# Patient Record
Sex: Male | Born: 1935 | Race: White | Hispanic: No | Marital: Married | Smoking: Former smoker
Health system: Southern US, Community
[De-identification: ages and names within clinical notes are randomized; demographics above are authoritative.]

## PROBLEM LIST (undated history)

## (undated) DIAGNOSIS — N401 Enlarged prostate with lower urinary tract symptoms: Secondary | ICD-10-CM

## (undated) DIAGNOSIS — I2510 Atherosclerotic heart disease of native coronary artery without angina pectoris: Secondary | ICD-10-CM

## (undated) DIAGNOSIS — I10 Essential (primary) hypertension: Secondary | ICD-10-CM

## (undated) DIAGNOSIS — N200 Calculus of kidney: Secondary | ICD-10-CM

## (undated) DIAGNOSIS — J302 Other seasonal allergic rhinitis: Secondary | ICD-10-CM

## (undated) DIAGNOSIS — E785 Hyperlipidemia, unspecified: Secondary | ICD-10-CM

## (undated) DIAGNOSIS — R338 Other retention of urine: Secondary | ICD-10-CM

## (undated) DIAGNOSIS — I499 Cardiac arrhythmia, unspecified: Secondary | ICD-10-CM

## (undated) DIAGNOSIS — E119 Type 2 diabetes mellitus without complications: Secondary | ICD-10-CM

## (undated) HISTORY — DX: Atherosclerotic heart disease of native coronary artery without angina pectoris: I25.10

## (undated) HISTORY — DX: Hyperlipidemia, unspecified: E78.5

## (undated) HISTORY — PX: TONSILLECTOMY: SUR1361

## (undated) HISTORY — PX: VASECTOMY: SHX75

## (undated) HISTORY — DX: Essential (primary) hypertension: I10

---

## 1993-11-04 HISTORY — PX: OTHER SURGICAL HISTORY: SHX169

## 1998-11-27 ENCOUNTER — Encounter: Payer: Self-pay | Admitting: Neurology

## 1998-11-27 ENCOUNTER — Ambulatory Visit (HOSPITAL_COMMUNITY): Admission: RE | Admit: 1998-11-27 | Discharge: 1998-11-27 | Payer: Self-pay | Admitting: Neurology

## 2000-10-02 ENCOUNTER — Encounter: Payer: Self-pay | Admitting: Emergency Medicine

## 2000-10-02 ENCOUNTER — Emergency Department (HOSPITAL_COMMUNITY): Admission: EM | Admit: 2000-10-02 | Discharge: 2000-10-02 | Payer: Self-pay | Admitting: Emergency Medicine

## 2001-07-31 ENCOUNTER — Encounter: Payer: Self-pay | Admitting: Interventional Cardiology

## 2001-07-31 ENCOUNTER — Ambulatory Visit (HOSPITAL_COMMUNITY): Admission: RE | Admit: 2001-07-31 | Discharge: 2001-07-31 | Payer: Self-pay | Admitting: Interventional Cardiology

## 2007-08-24 ENCOUNTER — Encounter: Admission: RE | Admit: 2007-08-24 | Discharge: 2007-08-24 | Payer: Self-pay | Admitting: Interventional Cardiology

## 2007-08-25 ENCOUNTER — Inpatient Hospital Stay (HOSPITAL_BASED_OUTPATIENT_CLINIC_OR_DEPARTMENT_OTHER): Admission: RE | Admit: 2007-08-25 | Discharge: 2007-08-25 | Payer: Self-pay | Admitting: Interventional Cardiology

## 2007-08-31 ENCOUNTER — Inpatient Hospital Stay (HOSPITAL_COMMUNITY): Admission: RE | Admit: 2007-08-31 | Discharge: 2007-09-01 | Payer: Self-pay | Admitting: Interventional Cardiology

## 2007-11-05 HISTORY — PX: CORONARY ANGIOPLASTY WITH STENT PLACEMENT: SHX49

## 2011-03-19 NOTE — Cardiovascular Report (Signed)
NAMESAMAR, DASS NO.:  [id number]   MEDICAL RECORD NO.:  [id number]          PATIENT TYPE:  INP   LOCATION:  6523                         FACILITY:  [facility]   PHYSICIAN:  Lyn Records, M.D.   DATE OF BIRTH:  1935/04/29   DATE OF PROCEDURE:  08/31/2007  DATE OF DISCHARGE:                            CARDIAC CATHETERIZATION   INDICATIONS:  Recent angina, recent abnormal Cardiolite, recent  demonstration of high-grade disease in the proximal and distal saphenous  vein graft to the PDA and distal circumflex.  The procedure is being  done to stent the saphenous vein graft in the in the spots noted.   PROCEDURE PERFORMED:  1. Filter wire distal protection for the proximal lesion.  2. Direct stenting with bare-metal Vision stent.  3. Vision bare-metal stent distal graft stenosis site.  No      complications.   DESCRIPTION:  After informed consent, a 6-French sheath was placed in  the right femoral artery using the modified Seldinger technique.  A 6-  French right bypass graft guide catheter was used to obtain guiding  shots.  The patient was given a bolus followed by an infusion of  Angiomax 0.75 mg/kg bolus over 2 minutes followed by 1.75 mg/kg per hour  infusion.  ACT was documented to be greater than 300.   A filter wire was placed across the proximal stenosis without difficulty  and deployed.  Direct stenting using an 18 mm x 3.5 Vision stent was  then performed.  Peak deployment pressure was 14 atmospheres.  We then  used a 4.0 x 12 mm Quantum Maverick balloon to 13 atmospheres for 30  seconds.  Good angiographic result was obtained.  The filter wire was  retrieved, and we turned our attention to the distal lesion.  We  predilated with a 2.5 x 12 mm Maverick over a BMW wire.  We then  deployed a 3.5 x 12 mm long Vision stent to 14 atmospheres.  The 3.5 mm  balloon size was clearly large enough for this distal site and there was  heavy proximal obstruction  without complete expansion.  We then placed a  3.5 x 8 mm Quantum proximally within the stent and dilated to 15  atmospheres.  There was still some residual minimal stenosis in the  proximal lesion, but there was wide patency without any evidence of  distal reduction in flow.   Angio-Seal and other closure devices were not being used because of  severe calcification in the femoral.  Final angiographic images were  pristine.  The case was terminated.   CONCLUSION:  1. Successful bare-metal stent implantation in the proximal RCA graft      with reduction to 70% stenosis to less than 10% with TIMI grade 3      flow and final postdilatation to 4-mm diameter.  2. Successful distal saphenous vein graft stenting with a Vision bare-      metal stent with final balloon high-pressure dilatation to 3.5 mm      diameter.  3. Beautiful angiographic results were obtained in each location with  less attempts and residual stenosis noted.  4. We did perform angiography of the native left coronary.  There is      50% protected left main with reference to      flow into the native circumflex.  We contemplated that at some      later staged date that perhaps the left main needs to be stented as      this seemed to be dependent on the flow through collaterals from      the distal right coronary circulation.      Lyn Records, M.D.  Electronically Signed     HWS/MEDQ  D:  08/31/2007  T:  08/31/2007  Job:  [id number]

## 2011-03-19 NOTE — Cardiovascular Report (Signed)
Cody Hobbs, Cody Hobbs NO.:  [id number]   MEDICAL RECORD NO.:  [id number]          PATIENT TYPE:  OIB   LOCATION:  1962                         FACILITY:  [facility]   PHYSICIAN:  Lyn Records, M.D.   DATE OF BIRTH:  02-28-1935   DATE OF PROCEDURE:  DATE OF DISCHARGE:                            CARDIAC CATHETERIZATION   INDICATIONS:  The patient recently had an episode of angina, but has had  none since.  We did a Cardiolite study that demonstrated inferior  infarction with peri-infarction ischemia and mid anterior wall ischemia.  The patient underwent coronary artery bypass graft in 1995 with a LIMA  to the LAD, the sequential saphenous vein graft to the PDA and the  distal circumflex and a saphenous vein graft to the mid obtuse marginal  of the circumflex.   PROCEDURES PERFORMED:  1. Left heart catheterization.  2. Selective coronary angiography.  3. Left ventriculography.  4. Bypass graft angiography.  5. Left ventriculography.  6. Abdominal aortography.   DESCRIPTION:  After informed consent, a 4-French sheath was placed in  the right femoral artery with some difficulty using the Doppler needle.  We then performed coronary angiography using a 4-French A2 multipurpose  catheter.  This catheter was used for hemodynamic recordings, left  ventriculography by hand injection, and saphenous vein graft  angiography.  We used a #4 4-French left Judkins catheter for left  coronary angiography.  We used a #4 internal mammary artery catheter for  saphenous vein graft angiography and left internal mammary graft  angiography.  We could never selectively engage in the right coronary.  This vessel is known to be totally occluded based upon the appearance of  the right coronary graft.   We then performed abdominal aortography using a 4-French pigtail  catheter using 40 mL of contrast at 16 mL per second.  The patient  tolerated the procedure without complications.   RESULTS:  1. Hemodynamic data:      a.     Aortic pressure 133/74.      b.     Left ventricular pressure 133/70 mmHg.  2. Left ventriculography:  The left ventricle is normal in size.      Inferior hypokinesis is noted.  Ejection fraction 55%.  No obvious      mitral regurgitation.  3. Coronary angiography.      a.     Left main coronary:  Left main contains proximal angulation       with 50-75% stenosis.      b.     Left anterior descending coronary:  Left anterior descending       coronary artery is totally occluded at the origin from the left       main.      c.     Circumflex artery:  The circumflex coronary artery is widely       patent arising from the distal left main.  The vessel is totally       occluded in the second obtuse marginal, which is supplied by a       distal saphenous vein graft anastomosis  to the right coronary.       The graft to the continuation portion of second obtuse marginal is       totally occluded.  The attenuation of the circumflex after the       first obtuse marginal is patent given origin to an additional       small obtuse marginal branch.      d.     Right coronary:  Right coronary is assumed to be totally       occluded at the aorto-ostial junction, as we were unable to       selectively engage this vessel.  4. Bypass graft angiography.      a.     Saphenous vein graft.  The right coronary and distal       circumflex:  This graft is patent, but contains eccentric       degenerative disease in the proximal vessel that obstructs it by       up to 50-70% depending upon view.  The distal anastomosis on the       PDA is patent with 50-60% narrowing at the anastomosis.  The       continuation of this graft to the distal circumflex contains a       relatively focal 99% stenosis distally.      b.     Saphenous vein graft to the obtuse marginal:  Totally       occluded at the aorto-ostial junction.      c.     Left internal mammary graft to the LAD:   Widely patent.       There is minimal distal disease in the LAD beyond the graft       insertion site.  There is significant disease proximal to the       graft insertion site with 1 septal perforator threatened.  5. Abdominal aortography:  No evidence of abdominal aortic aneurysm is      noted.  There is ostial/proximal eccentric 50-60% right renal      artery stenosis.  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   CONCLUSIONS:  1. 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  The proximal portion of this graft also contains      eccentric moderate stenosis.  2. Patent LIMA to the LAD.  3. Severe native coronary disease with total occlusion of the right      coronary and LAD and a 60-70% ostial left main stenosis.  The left      main supplies predominately, the remainder of the circumflex that      is not supplied by grafts.  4. Low normal LV function with inferior wall severe hypokinesis.  EF      55%.   PLAN:  1. Start Plavix.  2. Consider PCI on the distal RCA graft to the PDA continuation.  3. Consider PCI of the protected left main that supplies the      circumflex.  Will speak with the patient concerning  this.  Surgery      really be an option at this time given the wide patency of the left  main of the LIMA to the LAD.      Lyn Records, M.D.  Electronically Signed     HWS/MEDQ  D:  08/25/2007  T:  08/25/2007  Job:  [id number]   cc:   L. Lupe Carney, M.D.

## 2011-08-14 LAB — BASIC METABOLIC PANEL
BUN: 10
CO2: 27
CO2: 29
Calcium: 9.9
Chloride: 101
Chloride: 104
Creatinine, Ser: 0.98
GFR calc Af Amer: >60
GFR calc Af Amer: >60
GFR calc non Af Amer: >60
Glucose, Bld: 116 — ABNORMAL HIGH
Potassium: 4.3
Sodium: 138
Sodium: 140

## 2011-08-14 LAB — CBC
HCT: 46
Hemoglobin: 16
Hemoglobin: 17.1 — ABNORMAL HIGH
MCHC: 34.8
MCV: 92.2
RBC: 4.99
RBC: 5.4
WBC: 8

## 2012-10-26 ENCOUNTER — Other Ambulatory Visit: Payer: Self-pay | Admitting: Interventional Cardiology

## 2012-10-29 ENCOUNTER — Encounter (HOSPITAL_COMMUNITY): Payer: Self-pay

## 2012-10-30 ENCOUNTER — Encounter (HOSPITAL_COMMUNITY)
Admission: RE | Disposition: A | Discharge status: Home / Self Care | Payer: Self-pay | Source: Ambulatory Visit | Attending: Interventional Cardiology

## 2012-10-30 ENCOUNTER — Ambulatory Visit (HOSPITAL_COMMUNITY)
Admission: RE | Admit: 2012-10-30 | Discharge: 2012-10-30 | Disposition: A | Discharge status: Home / Self Care | Payer: Medicare Other | Source: Ambulatory Visit | Attending: Interventional Cardiology | Admitting: Interventional Cardiology

## 2012-10-30 DIAGNOSIS — I209 Angina pectoris, unspecified: Secondary | ICD-10-CM

## 2012-10-30 DIAGNOSIS — R9439 Abnormal result of other cardiovascular function study: Secondary | ICD-10-CM | POA: Insufficient documentation

## 2012-10-30 DIAGNOSIS — I25810 Atherosclerosis of coronary artery bypass graft(s) without angina pectoris: Secondary | ICD-10-CM | POA: Insufficient documentation

## 2012-10-30 DIAGNOSIS — I2510 Atherosclerotic heart disease of native coronary artery without angina pectoris: Secondary | ICD-10-CM | POA: Insufficient documentation

## 2012-10-30 DIAGNOSIS — I2582 Chronic total occlusion of coronary artery: Secondary | ICD-10-CM | POA: Insufficient documentation

## 2012-10-30 DIAGNOSIS — I200 Unstable angina: Secondary | ICD-10-CM | POA: Insufficient documentation

## 2012-10-30 HISTORY — PX: LEFT AND RIGHT HEART CATHETERIZATION WITH CORONARY/GRAFT ANGIOGRAM: SHX5448

## 2012-10-30 SURGERY — LEFT AND RIGHT HEART CATHETERIZATION WITH CORONARY/GRAFT ANGIOGRAM
Anesthesia: LOCAL

## 2012-10-30 MED ORDER — MIDAZOLAM HCL 2 MG/2ML IJ SOLN
INTRAMUSCULAR | Status: AC
  Filled 2012-10-30: qty 2

## 2012-10-30 MED ORDER — LIDOCAINE HCL (PF) 1 % IJ SOLN
INTRAMUSCULAR | Status: AC
  Filled 2012-10-30: qty 30

## 2012-10-30 MED ORDER — SODIUM CHLORIDE 0.9 % IJ SOLN: 3.0000 mL | INTRAMUSCULAR | Status: DC | PRN

## 2012-10-30 MED ORDER — SODIUM CHLORIDE 0.9 % IJ SOLN: 3.0000 mL | Freq: Two times a day (BID) | INTRAMUSCULAR | Status: DC

## 2012-10-30 MED ORDER — SODIUM CHLORIDE 0.9 % IV SOLN
250.0000 mL | INTRAVENOUS | Status: DC | PRN
  Administered 2012-10-30: 250 mL via INTRAVENOUS

## 2012-10-30 MED ORDER — FENTANYL CITRATE 0.05 MG/ML IJ SOLN
INTRAMUSCULAR | Status: AC
  Filled 2012-10-30: qty 2

## 2012-10-30 MED ORDER — DIAZEPAM 5 MG PO TABS
5.0000 mg | ORAL_TABLET | ORAL | Status: AC
  Administered 2012-10-30: 5 mg via ORAL

## 2012-10-30 MED ORDER — NITROGLYCERIN 0.2 MG/ML ON CALL CATH LAB
INTRAVENOUS | Status: AC
  Filled 2012-10-30: qty 1

## 2012-10-30 MED ORDER — SODIUM CHLORIDE 0.9 % IV SOLN
INTRAVENOUS | Status: DC
  Administered 2012-10-30: 08:00:00 via INTRAVENOUS

## 2012-10-30 MED ORDER — SODIUM CHLORIDE 0.9 % IV SOLN: INTRAVENOUS | Status: DC

## 2012-10-30 MED ORDER — ASPIRIN 81 MG PO CHEW
324.0000 mg | CHEWABLE_TABLET | ORAL | Status: AC
  Administered 2012-10-30: 324 mg via ORAL

## 2012-10-30 MED ORDER — ACETAMINOPHEN 325 MG PO TABS: 650.0000 mg | ORAL_TABLET | ORAL | Status: DC | PRN

## 2012-10-30 MED ORDER — HEPARIN (PORCINE) IN NACL 2-0.9 UNIT/ML-% IJ SOLN
INTRAMUSCULAR | Status: AC
  Filled 2012-10-30: qty 1000

## 2012-10-30 MED ORDER — ONDANSETRON HCL 4 MG/2ML IJ SOLN: 4.0000 mg | Freq: Four times a day (QID) | INTRAMUSCULAR | Status: DC | PRN

## 2012-10-30 NOTE — H&P (Signed)
The patient has a prior history of coronary artery disease. He has no bypass graft failure with occlusion of the saphenous vein graft to the circumflex system. In 2008 because of angina he was found to have high-grade obstruction in the proximal and distal portion of the saphenous vein graft to the right coronary. 2 stents were placed. He had a patent LIMA to the LAD at that time. The circumflex territory was dependent on all negative circulation. He had an early positive exercise treadmill test with ST depression and precordial ST abnormality less than 6 minutes on the treadmill.  The patient is undergoing diagnostic catheterization to define anatomy and help at therapy. The risk of the procedure including stroke, death, myocardial infarction, bleeding, emergency surgery, limb ischemia, among other this discussed with the patient in detail and accepted.

## 2012-10-30 NOTE — CV Procedure (Signed)
     Diagnostic Cardiac Catheterization Report  Cody Hobbs  77 y.o.  male 1935/10/19  Procedure Date: 10/30/2012 Referring Physician: Wyvonnia Lora, M.D. Primary Cardiologist:: Elwanda Brooklyn. Katrinka Blazing III M.D.   PROCEDURE:  Left heart catheterization with selective coronary angiography, left ventriculogram.  INDICATIONS:  Early positive exercise treadmill test with recent exertional angina, progressive over the past 6-9 months. History of coronary artery bypass grafting 1995, and PCI with proximal and distal stents placed in the RCA graft 2008.  The risks, benefits, and details of the procedure were explained to the patient.  The patient verbalized understanding and wanted to proceed.  Informed written consent was obtained.  PROCEDURE TECHNIQUE:  After Xylocaine anesthesia a 5 French sheath was placed in the right femoral artery with a single anterior needle wall stick.   Coronary angiography was done using a 5 French A2 MP and IMA catheter.  Left ventriculography was done using a 5 [place] A2 MP catheter.    CONTRAST:  Total of 105 cc.  COMPLICATIONS:  None.    HEMODYNAMICS:  Aortic pressure was 152/71 mmHg; LV pressure was 157/3 mmHg; LVEDP 13 mm mercury.  There was no gradient between the left ventricle and aorta.    ANGIOGRAPHIC DATA:   The left main coronary artery is 50% ostial and 80% distal. Heavy calcification is noted..  The left anterior descending artery is totally occluded at the left main distal ostium.  The left circumflex artery is heavily calcified. 3 obtuse marginal branches are noted. The second obtuse marginal is a dominant branch. It is a site of prior grafting. This vessel is diffusely diseased and 10 to anteriorly by the previous graft the first and third obtuse marginal branches are small and probably not graftable. The distal circumflex flex supplies collaterals to the distal right coronary  The right coronary artery is heavily calcified and totally occluded  in the proximal vessel. The distal right coronary supplied by left coronary collaterals via the circumflex . Collaterals are not identified connecting the left anterior descending to the right coronary territory  LEFT VENTRICULOGRAM:  Left ventricular angiogram was done in the 30 RAO projection and revealed normal left ventricular wall motion and systolic function with an estimated ejection fraction of 50 %.  BYPASS GRAFT ANGIOGRAPHY:    SVG to obtuse marginal #1: Totally occluded  SVG sequential to PDA and PLA: Totally occluded  IMPRESSIONS:  1. Saphenous vein graft failure with occlusion of the sequential saphenous vein graft to the PDA/PLA, and total occlusion of the SVG to the circumflex.  2. Widely patent left internal mammary artery graft to the LAD  3. Severe native vessel disease with total occlusion of the right coronary artery, LAD, and ostial and distal native left main disease up to 80%. The second obtuse marginal which is dominant circumflex branch is diffusely diseased and was assigned a prior grafting .  4. Overall normal left ventricular function EF 50%  RECOMMENDATION:  1. Consult Dr. Evelene Croon for consideration of repeat coronary bypass graft.

## 2012-11-02 DIAGNOSIS — I2510 Atherosclerotic heart disease of native coronary artery without angina pectoris: Secondary | ICD-10-CM | POA: Insufficient documentation

## 2012-11-02 DIAGNOSIS — E785 Hyperlipidemia, unspecified: Secondary | ICD-10-CM | POA: Insufficient documentation

## 2012-11-02 DIAGNOSIS — I10 Essential (primary) hypertension: Secondary | ICD-10-CM | POA: Insufficient documentation

## 2012-11-03 ENCOUNTER — Encounter: Payer: Medicare Other | Admitting: Surgery

## 2012-11-05 ENCOUNTER — Other Ambulatory Visit: Payer: Self-pay | Admitting: Surgery

## 2012-11-05 ENCOUNTER — Encounter: Payer: Self-pay | Admitting: Surgery

## 2012-11-05 ENCOUNTER — Institutional Professional Consult (permissible substitution) (INDEPENDENT_AMBULATORY_CARE_PROVIDER_SITE_OTHER): Payer: Medicare Other | Admitting: Surgery

## 2012-11-05 VITALS — BP 142/84 | HR 76 | Resp 20 | Ht 76.0 in | Wt 220.0 lb

## 2012-11-05 DIAGNOSIS — I2510 Atherosclerotic heart disease of native coronary artery without angina pectoris: Secondary | ICD-10-CM

## 2012-11-05 DIAGNOSIS — Z951 Presence of aortocoronary bypass graft: Secondary | ICD-10-CM

## 2012-11-09 ENCOUNTER — Encounter: Payer: Self-pay | Admitting: Surgery

## 2012-11-09 NOTE — Progress Notes (Signed)
[address]            Cody Hobbs [id number]          [phone]      PCP is Cody Boston, MD Referring Provider is Cody Noe, MD  Chief Complaint  Patient presents with  . Coronary Artery Disease    cardiac cath 10/30/12, eval for re-do of CABG, HX of CABG 1995    HPI:  The patient is a 78 year old who underwent coronary bypass graft surgery x4 by me in November of 1995. He had a left internal mammary graft to the LAD with a saphenous vein graft to the second obtuse marginal branch and a sequential saphenous vein graft to the posterior descending branch of the right coronary artery and the posterior lateral branch of the left circumflex coronary artery. He subsequently had a PCI with proximal and distal stents placed in the right coronary vein graft in 2008 or 2009. He was recently seen in annual followup by Dr. Katrinka Hobbs and related that he had had one episode of substernal chest discomfort with heavy exertion while he was deer hunting. He said this resolved spontaneously. He denies any other episodes of chest discomfort or shortness of breath. He had an early positive exercise treadmill test with stress images showing a moderate to severe perfusion defect due to infarct or scar with mild peri-infarct ischemia in the basal inferoseptal, basal inferior, and mid inferior regions. There was a mild perfusion defect due to infarct or scar with moderate peri-infarct ischemia in the basal anterolateral, mid inferior lateral, mid anterolateral, and apical lateral regions. There was a mild perfusion defect due to infarct or scar with mild peri-infarct ischemia in the basal anterior, basal anterolateral, and mid anterior regions. The post-stress ejection fraction is 59%. The stress ECG showed ischemic changes from the baseline ECG. He subsequently underwent cardiac catheterization on 10/30/2012 which showed occlusion of the 2 previous saphenous vein grafts. There  is a widely patent left internal mammary graft to the LAD. There is severe native vessel disease with total occlusion of the right coronary artery, LAD, and ostial and distal native left main disease up to 80%. The second obtuse marginal branch was the dominant left circumflex branch and was diffusely diseased.   Past Medical History  Diagnosis Date  . CAD (coronary artery disease)   . Hypertension   . Hyperlipidemia     Past Surgical History  Procedure Date  . Tonsillectomy   . Vasectomy   . Cabg with lima to lad, svg to om2 and sequential svg to pda and distal circumflex 1995  . Coronary angioplasty with stent placement 2009    Family History  Problem Relation Age of Onset  . Heart attack Father   . Stomach cancer Mother   . Heart attack Brother   . Breast cancer Sister     Social History History  Substance Use Topics  . Smoking status: Former Smoker    Types: Cigarettes    Start date: 11/04/1992  . Smokeless tobacco: Never Used  . Alcohol Use: Not on file    Current Outpatient Prescriptions  Medication Sig Dispense Refill  . aspirin EC 81 MG tablet Take 81 mg by mouth daily.      Marland Kitchen atenolol (TENORMIN) 50 MG tablet Take 50 mg by mouth daily.      . simvastatin (ZOCOR) 40 MG  tablet Take 40 mg by mouth every evening.        Allergies  Allergen Reactions  . Penicillins Itching    Review of Systems  Constitutional: Negative for fever, chills, diaphoresis, activity change, appetite change, fatigue and unexpected weight change.  HENT: Negative.   Eyes: Negative.   Respiratory: Negative.   Cardiovascular: Positive for chest pain. Negative for palpitations and leg swelling.       Once  Gastrointestinal: Negative.   Genitourinary: Positive for frequency.  Musculoskeletal: Negative.   Neurological: Negative.   Hematological: Bruises/bleeds easily.  Psychiatric/Behavioral: Negative.     BP 142/84  Pulse 76  Resp 20  Ht 6\' 4"  (1.93 m)  Wt 220 lb (99.791 kg)   BMI 26.78 kg/m2  SpO2 95% Physical Exam  Constitutional: He is oriented to person, place, and time. He appears well-developed and well-nourished. No distress.  HENT:  Head: Normocephalic and atraumatic.  Mouth/Throat: Oropharynx is clear and moist.  Eyes: Conjunctivae normal and EOM are normal. Pupils are equal, round, and reactive to light.  Neck: Normal range of motion. Neck supple. No JVD present. No thyromegaly present.  Cardiovascular: Normal rate, regular rhythm, normal heart sounds and intact distal pulses.  Exam reveals no gallop and no friction rub.   No murmur heard. Pulmonary/Chest: Effort normal and breath sounds normal. No respiratory distress.  Abdominal: Soft. Bowel sounds are normal. He exhibits no distension and no mass. There is no tenderness.  Musculoskeletal: Normal range of motion. He exhibits no edema.  Neurological: He is alert and oriented to person, place, and time. He has normal strength. No cranial nerve deficit or sensory deficit.  Skin: Skin is warm and dry.  Psychiatric: He has a normal mood and affect.     Cardiac Cath:  HEMODYNAMICS:  Aortic pressure was 152/71 mmHg; LV pressure was 157/3 mmHg; LVEDP 13 mm mercury.  There was no gradient between the left ventricle and aorta.    ANGIOGRAPHIC DATA:   The left main coronary artery is 50% ostial and 80% distal. Heavy calcification is noted..  The left anterior descending artery is totally occluded at the left main distal ostium.  The left circumflex artery is heavily calcified. 3 obtuse marginal branches are noted. The second obtuse marginal is a dominant branch. It is a site of prior grafting. This vessel is diffusely diseased and 10 to anteriorly by the previous graft the first and third obtuse marginal branches are small and probably not graftable. The distal circumflex flex supplies collaterals to the distal right coronary  The right coronary artery is heavily calcified and totally occluded in the  proximal vessel. The distal right coronary supplied by left coronary collaterals via the circumflex . Collaterals are not identified connecting the left anterior descending to the right coronary territory  LEFT VENTRICULOGRAM:  Left ventricular angiogram was done in the 30 RAO projection and revealed normal left ventricular wall motion and systolic function with an estimated ejection fraction of 50 %.  BYPASS GRAFT ANGIOGRAPHY:    SVG to obtuse marginal #1: Totally occluded  SVG sequential to PDA and PLA: Totally occluded  IMPRESSIONS:  1. Saphenous vein graft failure with occlusion of the sequential saphenous vein graft to the PDA/PLA, and total occlusion of the SVG to the circumflex.  2. Widely patent left internal mammary artery graft to the LAD  3. Severe native vessel disease with total occlusion of the right coronary artery, LAD, and ostial and distal native left main disease up to 80%.  The second obtuse marginal which is dominant circumflex branch is diffusely diseased and was assigned a prior grafting .  4. Overall normal left ventricular function EF 50%  RECOMMENDATION:  1. Consult Dr. Evelene Croon for consideration of repeat coronary bypass graft.   Impression/plan:  He has severe left main and native three-vessel coronary disease with an occluded right coronary artery that is being supplied distally by collaterals from the left circumflex coronary artery. Therefore the left main disease puts the left circumflex and right coronary territories at risk. The second obtuse marginal branch is diffusely diseased and may not be graftable. I think the posterior descending and posterior lateral branches looked like they should be graftable which would supply some degree of protection against occlusion of the left main coronary. He is still quite active at 78 years old and in fairly good shape so I think redo coronary bypass graft surgery is probably the best alternative for him. I reviewed  the catheterization findings with the patient and his wife. All of their questions were answered. They would like to wait until April to do surgery since his wife is a Chart[employer] loss adjuster and will not be able to take off until then. Since he has only had one episode of chest discomfort with heavy exertion I think it would be reasonable to wait as long as he does not have any further symptoms. He and his wife are in agreement with that and they will check their calendar and schedule surgery for April. I asked him to contact Dr. Katrinka Hobbs and myself if he has any progression of symptoms.

## 2012-11-17 ENCOUNTER — Other Ambulatory Visit: Payer: Self-pay | Admitting: *Deleted

## 2012-11-17 DIAGNOSIS — I2510 Atherosclerotic heart disease of native coronary artery without angina pectoris: Secondary | ICD-10-CM

## 2012-11-18 ENCOUNTER — Encounter (HOSPITAL_COMMUNITY): Payer: Self-pay | Admitting: Pharmacy Technician

## 2012-11-23 ENCOUNTER — Encounter (HOSPITAL_COMMUNITY): Payer: Self-pay

## 2012-11-23 ENCOUNTER — Ambulatory Visit (HOSPITAL_COMMUNITY)
Admission: RE | Admit: 2012-11-23 | Discharge: 2012-11-23 | Disposition: A | Discharge status: Home / Self Care | Payer: Medicare Other | Source: Ambulatory Visit | Attending: Surgery | Admitting: Surgery

## 2012-11-23 ENCOUNTER — Encounter (HOSPITAL_COMMUNITY)
Admission: RE | Admit: 2012-11-23 | Discharge: 2012-11-23 | Disposition: A | Discharge status: Home / Self Care | Payer: Medicare Other | Source: Ambulatory Visit | Attending: Surgery | Admitting: Surgery

## 2012-11-23 VITALS — BP 133/85 | HR 76 | Temp 98.6°F | Resp 18 | Ht 76.0 in | Wt 222.9 lb

## 2012-11-23 DIAGNOSIS — Z01812 Encounter for preprocedural laboratory examination: Secondary | ICD-10-CM | POA: Insufficient documentation

## 2012-11-23 DIAGNOSIS — I2510 Atherosclerotic heart disease of native coronary artery without angina pectoris: Secondary | ICD-10-CM | POA: Insufficient documentation

## 2012-11-23 DIAGNOSIS — I10 Essential (primary) hypertension: Secondary | ICD-10-CM | POA: Insufficient documentation

## 2012-11-23 DIAGNOSIS — Z01811 Encounter for preprocedural respiratory examination: Secondary | ICD-10-CM | POA: Insufficient documentation

## 2012-11-23 DIAGNOSIS — I6529 Occlusion and stenosis of unspecified carotid artery: Secondary | ICD-10-CM | POA: Insufficient documentation

## 2012-11-23 DIAGNOSIS — Z01810 Encounter for preprocedural cardiovascular examination: Secondary | ICD-10-CM | POA: Insufficient documentation

## 2012-11-23 HISTORY — DX: Calculus of kidney: N20.0

## 2012-11-23 LAB — BLOOD GAS, ARTERIAL
Acid-Base Excess: 0.5 mmol/L (ref 0.0–2.0)
Bicarbonate: 24.6 mEq/L — ABNORMAL HIGH (ref 20.0–24.0)
Drawn by: 344381
O2 Saturation: 96.3 %
TCO2: 25.7 mmol/L (ref 0–100)
pCO2 arterial: 38.9 mmHg (ref 35.0–45.0)
pO2, Arterial: 78.2 mmHg — ABNORMAL LOW (ref 80.0–100.0)

## 2012-11-23 LAB — COMPREHENSIVE METABOLIC PANEL
ALT: 20 U/L (ref 0–53)
AST: 26 U/L (ref 0–37)
CO2: 20 mEq/L (ref 19–32)
Calcium: 9.7 mg/dL (ref 8.4–10.5)
Chloride: 103 mEq/L (ref 96–112)
Creatinine, Ser: 0.92 mg/dL (ref 0.50–1.35)
GFR calc Af Amer: >90 mL/min (ref >90)
GFR calc non Af Amer: 80 mL/min — ABNORMAL LOW (ref >90)
Glucose, Bld: 101 mg/dL — ABNORMAL HIGH (ref 70–99)
Sodium: 137 mEq/L (ref 135–145)
Total Bilirubin: 0.6 mg/dL (ref 0.3–1.2)

## 2012-11-23 LAB — CBC
Hemoglobin: 17.1 g/dL — ABNORMAL HIGH (ref 13.0–17.0)
MCH: 33.3 pg (ref 26.0–34.0)
MCV: 93 fL (ref 78.0–100.0)
Platelets: 192 10*3/uL (ref 150–400)
RBC: 5.13 MIL/uL (ref 4.22–5.81)
WBC: 8.4 10*3/uL (ref 4.0–10.5)

## 2012-11-23 LAB — PULMONARY FUNCTION TEST

## 2012-11-23 LAB — SURGICAL PCR SCREEN: MRSA, PCR: NEGATIVE

## 2012-11-23 LAB — URINALYSIS, ROUTINE W REFLEX MICROSCOPIC
Leukocytes, UA: NEGATIVE
Nitrite: NEGATIVE
Protein, ur: NEGATIVE mg/dL
Specific Gravity, Urine: 1.027 (ref 1.005–1.030)
Urobilinogen, UA: 0.2 mg/dL (ref 0.0–1.0)

## 2012-11-23 NOTE — Progress Notes (Signed)
Pre-op Cardiac Surgery  Carotid Findings:  Right = <40% ICA stenosis. Left = 40-59% ICA stenosis. Antegrade vertebral flow.  Upper Extremity Right Left  Brachial Pressures 145 140  Radial Waveforms Tri Tri  Ulnar Waveforms Tri Tri  Palmar Arch (Allen's Test) Decreases >50% with radial compression, normal with ulnar compression Normal with radial compression, obliterates with ulnar compression    Lower  Extremity Right Left  Dorsalis Pedis 130, Bi 160, Tri  Anterior Tibial    Posterior Tibial 118, Bi 137, Bi  Ankle/Brachial Indices 0.9 1.1    Findings:  Right ABI= mild arterial disease. Left ABI= within normal limits.  Farrel Demark, RDMS, RVT 11/23/2012

## 2012-11-23 NOTE — Pre-Procedure Instructions (Addendum)
GOVANNI PLEMONS  11/23/2012   Your procedure is scheduled on:  Wednesday, January 22nd.  Report to [facility] at 6:30AM.    Call this number if you have problems the morning of surgery: [phone]   Remember:   Do not eat food or drink liquids after midnight.   Take these medicines the morning of surgery with A SIP OF WATER: Atenolol (Tenormin).  Stop taking Aspirin, Coumadin, Plavix, Effient and Herbal medications.  Don not take any NSAIDs ie: Ibuprofen,  Advil,Naproxen or any medication containing Aspirin.   Do not wear jewelry, make-up or nail polish.  Do not wear lotions, powders, or perfumes. You may wear deodorant.  Do not shave 48 hours prior to surgery. Men may shave face and neck.  Do not bring valuables to the hospital.  Contacts, dentures or bridgework may not be worn into surgery.  Leave suitcase in the car. After surgery it may be brought to your room.  For patients admitted to the hospital, checkout time is 11:00 AM the day of discharge.   Patients discharged the day of surgery will not be allowed to drive home.  Name and phone number of your driver: NA   Special Instructions: Shower using CHG 2 nights before surgery and the night before surgery.  If you shower the day of surgery use CHG.  Use special wash - you have one bottle of CHG for all showers.  You should use approximately 1/3 of the bottle for each shower.   Please read over the following fact sheets that you were given: Pain Booklet, Coughing and Deep Breathing, Blood Transfusion Information and Surgical Site Infection Prevention

## 2012-11-24 MED ORDER — VERAPAMIL HCL 2.5 MG/ML IV SOLN
INTRAVENOUS | Status: AC
  Administered 2012-11-25: 10:00:00
  Filled 2012-11-24 (×2): qty 2.5

## 2012-11-24 MED ORDER — VANCOMYCIN HCL 10 G IV SOLR
1500.0000 mg | INTRAVENOUS | Status: AC
  Administered 2012-11-25: 1500 mg via INTRAVENOUS
  Filled 2012-11-24: qty 1500

## 2012-11-24 MED ORDER — MAGNESIUM SULFATE 50 % IJ SOLN
40.0000 meq | INTRAMUSCULAR | Status: DC
  Filled 2012-11-24 (×2): qty 10

## 2012-11-24 MED ORDER — PHENYLEPHRINE HCL 10 MG/ML IJ SOLN
30.0000 ug/min | INTRAVENOUS | Status: AC
  Administered 2012-11-25: 60 ug/min via INTRAVENOUS
  Filled 2012-11-24 (×2): qty 2

## 2012-11-24 MED ORDER — METOPROLOL TARTRATE 12.5 MG HALF TABLET: 12.5000 mg | ORAL_TABLET | Freq: Once | ORAL | Status: DC

## 2012-11-24 MED ORDER — SODIUM CHLORIDE 0.9 % IV SOLN
INTRAVENOUS | Status: AC
  Administered 2012-11-25: 2.4 [IU]/h via INTRAVENOUS
  Filled 2012-11-24 (×2): qty 1

## 2012-11-24 MED ORDER — DOPAMINE-DEXTROSE 3.2-5 MG/ML-% IV SOLN
2.0000 ug/kg/min | INTRAVENOUS | Status: DC
  Filled 2012-11-24: qty 250

## 2012-11-24 MED ORDER — SODIUM CHLORIDE 0.9 % IV SOLN
INTRAVENOUS | Status: AC
  Administered 2012-11-25: 70 mL/h via INTRAVENOUS
  Filled 2012-11-24 (×2): qty 40

## 2012-11-24 MED ORDER — NITROGLYCERIN IN D5W 200-5 MCG/ML-% IV SOLN
2.0000 ug/min | INTRAVENOUS | Status: AC
  Administered 2012-11-25: 5 ug/min via INTRAVENOUS
  Filled 2012-11-24 (×2): qty 250

## 2012-11-24 MED ORDER — POTASSIUM CHLORIDE 2 MEQ/ML IV SOLN
80.0000 meq | INTRAVENOUS | Status: DC
  Filled 2012-11-24 (×2): qty 40

## 2012-11-24 MED ORDER — DEXMEDETOMIDINE HCL IN NACL 400 MCG/100ML IV SOLN
0.1000 ug/kg/h | INTRAVENOUS | Status: AC
  Administered 2012-11-25: .2 ug/kg/h via INTRAVENOUS
  Filled 2012-11-24 (×2): qty 100

## 2012-11-24 MED ORDER — DEXTROSE 5 % IV SOLN
0.5000 ug/min | INTRAVENOUS | Status: DC
  Filled 2012-11-24 (×2): qty 4

## 2012-11-24 MED ORDER — LEVOFLOXACIN IN D5W 500 MG/100ML IV SOLN
500.0000 mg | INTRAVENOUS | Status: AC
  Administered 2012-11-25: .5 g via INTRAVENOUS
  Filled 2012-11-24 (×2): qty 100

## 2012-11-25 ENCOUNTER — Inpatient Hospital Stay (HOSPITAL_COMMUNITY): Payer: Medicare Other | Admitting: Anesthesiology

## 2012-11-25 ENCOUNTER — Encounter (HOSPITAL_COMMUNITY): Payer: Self-pay | Admitting: Anesthesiology

## 2012-11-25 ENCOUNTER — Encounter (HOSPITAL_COMMUNITY)
Admission: RE | Disposition: A | Discharge status: Home / Self Care | Payer: Self-pay | Source: Ambulatory Visit | Attending: Surgery

## 2012-11-25 ENCOUNTER — Inpatient Hospital Stay (HOSPITAL_COMMUNITY): Payer: Medicare Other

## 2012-11-25 ENCOUNTER — Encounter (HOSPITAL_COMMUNITY): Payer: Self-pay | Admitting: Surgery

## 2012-11-25 ENCOUNTER — Inpatient Hospital Stay (HOSPITAL_COMMUNITY)
Admission: RE | Admit: 2012-11-25 | Discharge: 2012-11-29 | DRG: 236 | Disposition: A | Discharge status: Home / Self Care | Payer: Medicare Other | Source: Ambulatory Visit | Attending: Surgery | Admitting: Surgery

## 2012-11-25 DIAGNOSIS — D62 Acute posthemorrhagic anemia: Secondary | ICD-10-CM | POA: Diagnosis not present

## 2012-11-25 DIAGNOSIS — E119 Type 2 diabetes mellitus without complications: Secondary | ICD-10-CM | POA: Diagnosis present

## 2012-11-25 DIAGNOSIS — I209 Angina pectoris, unspecified: Secondary | ICD-10-CM | POA: Diagnosis present

## 2012-11-25 DIAGNOSIS — I10 Essential (primary) hypertension: Secondary | ICD-10-CM | POA: Diagnosis present

## 2012-11-25 DIAGNOSIS — E785 Hyperlipidemia, unspecified: Secondary | ICD-10-CM | POA: Diagnosis present

## 2012-11-25 DIAGNOSIS — I2510 Atherosclerotic heart disease of native coronary artery without angina pectoris: Secondary | ICD-10-CM

## 2012-11-25 DIAGNOSIS — Z951 Presence of aortocoronary bypass graft: Secondary | ICD-10-CM

## 2012-11-25 DIAGNOSIS — Z9861 Coronary angioplasty status: Secondary | ICD-10-CM

## 2012-11-25 DIAGNOSIS — I2582 Chronic total occlusion of coronary artery: Secondary | ICD-10-CM | POA: Diagnosis present

## 2012-11-25 HISTORY — PX: CORONARY ARTERY BYPASS GRAFT: SHX141

## 2012-11-25 LAB — POCT I-STAT 3, ART BLOOD GAS (G3+)
Bicarbonate: 26.4 mEq/L — ABNORMAL HIGH (ref 20.0–24.0)
Bicarbonate: 23.1 mEq/L (ref 20.0–24.0)
O2 Saturation: 100 %
O2 Saturation: 97 %
O2 Saturation: 93 %
Patient temperature: 35.6
TCO2: 28 mmol/L (ref 0–100)
TCO2: 24 mmol/L (ref 0–100)
pCO2 arterial: 36.5 mmHg (ref 35.0–45.0)
pCO2 arterial: 45.1 mmHg — ABNORMAL HIGH (ref 35.0–45.0)
pCO2 arterial: 44.1 mmHg (ref 35.0–45.0)
pH, Arterial: 7.325 — ABNORMAL LOW (ref 7.350–7.450)
pH, Arterial: 7.409 (ref 7.350–7.450)
pH, Arterial: 7.383 (ref 7.350–7.450)
pH, Arterial: 7.308 — ABNORMAL LOW (ref 7.350–7.450)
pO2, Arterial: 101 mmHg — ABNORMAL HIGH (ref 80.0–100.0)
pO2, Arterial: 71 mmHg — ABNORMAL LOW (ref 80.0–100.0)

## 2012-11-25 LAB — GLUCOSE, CAPILLARY
Glucose-Capillary: 111 mg/dL — ABNORMAL HIGH (ref 70–99)
Glucose-Capillary: 116 mg/dL — ABNORMAL HIGH (ref 70–99)
Glucose-Capillary: 144 mg/dL — ABNORMAL HIGH (ref 70–99)
Glucose-Capillary: 154 mg/dL — ABNORMAL HIGH (ref 70–99)
Glucose-Capillary: 168 mg/dL — ABNORMAL HIGH (ref 70–99)

## 2012-11-25 LAB — POCT I-STAT 4, (NA,K, GLUC, HGB,HCT)
Glucose, Bld: 117 mg/dL — ABNORMAL HIGH (ref 70–99)
Glucose, Bld: 103 mg/dL — ABNORMAL HIGH (ref 70–99)
Glucose, Bld: 109 mg/dL — ABNORMAL HIGH (ref 70–99)
Glucose, Bld: 102 mg/dL — ABNORMAL HIGH (ref 70–99)
HCT: 41 % (ref 39.0–52.0)
HCT: 35 % — ABNORMAL LOW (ref 39.0–52.0)
Hemoglobin: 15.3 g/dL (ref 13.0–17.0)
Hemoglobin: 13.9 g/dL (ref 13.0–17.0)
Hemoglobin: 10.2 g/dL — ABNORMAL LOW (ref 13.0–17.0)
Hemoglobin: 10.5 g/dL — ABNORMAL LOW (ref 13.0–17.0)
Potassium: 4.5 mEq/L (ref 3.5–5.1)
Potassium: 4.8 mEq/L (ref 3.5–5.1)
Potassium: 4.5 mEq/L (ref 3.5–5.1)
Potassium: 4.8 mEq/L (ref 3.5–5.1)
Potassium: 3.9 mEq/L (ref 3.5–5.1)
Sodium: 143 mEq/L (ref 135–145)
Sodium: 143 mEq/L (ref 135–145)
Sodium: 137 mEq/L (ref 135–145)
Sodium: 138 mEq/L (ref 135–145)

## 2012-11-25 LAB — HEMOGLOBIN AND HEMATOCRIT, BLOOD
HCT: 36.3 % — ABNORMAL LOW (ref 39.0–52.0)
Hemoglobin: 12.5 g/dL — ABNORMAL LOW (ref 13.0–17.0)

## 2012-11-25 LAB — PROTIME-INR: INR: 1.54 — ABNORMAL HIGH (ref 0.00–1.49)

## 2012-11-25 LAB — CBC
HCT: 31.2 % — ABNORMAL LOW (ref 39.0–52.0)
Hemoglobin: 11 g/dL — ABNORMAL LOW (ref 13.0–17.0)
MCV: 92.6 fL (ref 78.0–100.0)
RBC: 3.37 MIL/uL — ABNORMAL LOW (ref 4.22–5.81)
RDW: 12.7 % (ref 11.5–15.5)
WBC: 10.1 10*3/uL (ref 4.0–10.5)

## 2012-11-25 LAB — POCT I-STAT, CHEM 8
Hemoglobin: 11.9 g/dL — ABNORMAL LOW (ref 13.0–17.0)
Sodium: 144 mEq/L (ref 135–145)
TCO2: 25 mmol/L (ref 0–100)

## 2012-11-25 LAB — APTT: aPTT: 34 seconds (ref 24–37)

## 2012-11-25 LAB — CREATININE, SERUM: Creatinine, Ser: 0.92 mg/dL (ref 0.50–1.35)

## 2012-11-25 LAB — MAGNESIUM: Magnesium: 2.8 mg/dL — ABNORMAL HIGH (ref 1.5–2.5)

## 2012-11-25 SURGERY — REDO CORONARY ARTERY BYPASS GRAFTING (CABG)
Anesthesia: General | Site: Chest | Wound class: Clean

## 2012-11-25 MED ORDER — OXYCODONE HCL 5 MG PO TABS
5.0000 mg | ORAL_TABLET | ORAL | Status: DC | PRN
  Administered 2012-11-26: 5 mg via ORAL
  Administered 2012-11-26 (×3): 10 mg via ORAL
  Administered 2012-11-26: 5 mg via ORAL
  Administered 2012-11-27: 10 mg via ORAL
  Filled 2012-11-25 (×2): qty 2
  Filled 2012-11-25 (×2): qty 1
  Filled 2012-11-25 (×2): qty 2

## 2012-11-25 MED ORDER — HEPARIN SODIUM (PORCINE) 1000 UNIT/ML IJ SOLN
INTRAMUSCULAR | Status: DC | PRN
  Administered 2012-11-25: 32000 [IU] via INTRAVENOUS

## 2012-11-25 MED ORDER — PROTAMINE SULFATE 10 MG/ML IV SOLN
INTRAVENOUS | Status: DC | PRN
  Administered 2012-11-25: 300 mg via INTRAVENOUS

## 2012-11-25 MED ORDER — SODIUM CHLORIDE 0.9 % IV SOLN
INTRAVENOUS | Status: DC | PRN
  Administered 2012-11-25: 13:00:00 via INTRAVENOUS

## 2012-11-25 MED ORDER — SODIUM CHLORIDE 0.9 % IJ SOLN
3.0000 mL | Freq: Two times a day (BID) | INTRAMUSCULAR | Status: DC
  Administered 2012-11-26 – 2012-11-28 (×6): 3 mL via INTRAVENOUS

## 2012-11-25 MED ORDER — LACTATED RINGERS IV SOLN: 500.0000 mL | Freq: Once | INTRAVENOUS | Status: AC | PRN

## 2012-11-25 MED ORDER — SODIUM CHLORIDE 0.9 % IJ SOLN
3.0000 mL | INTRAMUSCULAR | Status: DC | PRN
  Administered 2012-11-27: 3 mL via INTRAVENOUS

## 2012-11-25 MED ORDER — MIDAZOLAM HCL 5 MG/5ML IJ SOLN
INTRAMUSCULAR | Status: DC | PRN
  Administered 2012-11-25: 1.5 mg via INTRAVENOUS
  Administered 2012-11-25 (×2): 2 mg via INTRAVENOUS
  Administered 2012-11-25: 4.5 mg via INTRAVENOUS

## 2012-11-25 MED ORDER — SODIUM CHLORIDE 0.9 % IV SOLN
INTRAVENOUS | Status: DC
  Administered 2012-11-25: 15:00:00 via INTRAVENOUS

## 2012-11-25 MED ORDER — ACETAMINOPHEN 160 MG/5ML PO SOLN
975.0000 mg | Freq: Four times a day (QID) | ORAL | Status: DC
  Filled 2012-11-25: qty 40.6

## 2012-11-25 MED ORDER — METOPROLOL TARTRATE 1 MG/ML IV SOLN: 2.5000 mg | INTRAVENOUS | Status: DC | PRN

## 2012-11-25 MED ORDER — MIDAZOLAM HCL 2 MG/2ML IJ SOLN: 2.0000 mg | INTRAMUSCULAR | Status: DC | PRN

## 2012-11-25 MED ORDER — ASPIRIN EC 325 MG PO TBEC
325.0000 mg | DELAYED_RELEASE_TABLET | Freq: Every day | ORAL | Status: DC
  Administered 2012-11-26 – 2012-11-29 (×4): 325 mg via ORAL
  Filled 2012-11-25 (×4): qty 1

## 2012-11-25 MED ORDER — METOPROLOL TARTRATE 12.5 MG HALF TABLET
12.5000 mg | ORAL_TABLET | Freq: Two times a day (BID) | ORAL | Status: DC
  Administered 2012-11-26 (×3): 12.5 mg via ORAL
  Filled 2012-11-25 (×5): qty 1

## 2012-11-25 MED ORDER — MAGNESIUM SULFATE 40 MG/ML IJ SOLN
4.0000 g | Freq: Once | INTRAMUSCULAR | Status: AC
  Administered 2012-11-25: 4 g via INTRAVENOUS
  Filled 2012-11-25: qty 100

## 2012-11-25 MED ORDER — HEMOSTATIC AGENTS (NO CHARGE) OPTIME
TOPICAL | Status: DC | PRN
  Administered 2012-11-25: 1 via TOPICAL

## 2012-11-25 MED ORDER — LACTATED RINGERS IV SOLN
INTRAVENOUS | Status: DC | PRN
  Administered 2012-11-25 (×2): via INTRAVENOUS

## 2012-11-25 MED ORDER — SIMVASTATIN 40 MG PO TABS
40.0000 mg | ORAL_TABLET | Freq: Every day | ORAL | Status: DC
  Administered 2012-11-25 – 2012-11-28 (×4): 40 mg via ORAL
  Filled 2012-11-25 (×5): qty 1

## 2012-11-25 MED ORDER — NITROGLYCERIN IN D5W 200-5 MCG/ML-% IV SOLN: 0.0000 ug/min | INTRAVENOUS | Status: DC

## 2012-11-25 MED ORDER — METOPROLOL TARTRATE 25 MG/10 ML ORAL SUSPENSION
12.5000 mg | Freq: Two times a day (BID) | ORAL | Status: DC
  Filled 2012-11-25 (×5): qty 5

## 2012-11-25 MED ORDER — LIDOCAINE HCL (CARDIAC) 20 MG/ML IV SOLN
INTRAVENOUS | Status: DC | PRN
  Administered 2012-11-25: 100 mg via INTRAVENOUS

## 2012-11-25 MED ORDER — 0.9 % SODIUM CHLORIDE (POUR BTL) OPTIME
TOPICAL | Status: DC | PRN
  Administered 2012-11-25: 1000 mL

## 2012-11-25 MED ORDER — ALBUMIN HUMAN 5 % IV SOLN
250.0000 mL | INTRAVENOUS | Status: AC | PRN
  Administered 2012-11-25: 250 mL via INTRAVENOUS

## 2012-11-25 MED ORDER — VANCOMYCIN HCL IN DEXTROSE 1-5 GM/200ML-% IV SOLN
1000.0000 mg | Freq: Once | INTRAVENOUS | Status: AC
  Administered 2012-11-25: 1000 mg via INTRAVENOUS
  Filled 2012-11-25: qty 200

## 2012-11-25 MED ORDER — INSULIN REGULAR BOLUS VIA INFUSION
0.0000 [IU] | Freq: Three times a day (TID) | INTRAVENOUS | Status: DC
  Filled 2012-11-25: qty 10

## 2012-11-25 MED ORDER — LACTATED RINGERS IV SOLN
INTRAVENOUS | Status: DC | PRN
  Administered 2012-11-25: 08:00:00 via INTRAVENOUS

## 2012-11-25 MED ORDER — BIOTENE DRY MOUTH MT LIQD
15.0000 mL | Freq: Two times a day (BID) | OROMUCOSAL | Status: DC
  Administered 2012-11-25 – 2012-11-26 (×3): 15 mL via OROMUCOSAL

## 2012-11-25 MED ORDER — BISACODYL 10 MG RE SUPP: 10.0000 mg | Freq: Every day | RECTAL | Status: DC

## 2012-11-25 MED ORDER — SODIUM CHLORIDE 0.9 % IV SOLN
INTRAVENOUS | Status: DC
  Administered 2012-11-25: via INTRAVENOUS
  Filled 2012-11-25: qty 1

## 2012-11-25 MED ORDER — SUFENTANIL CITRATE 50 MCG/ML IV SOLN
INTRAVENOUS | Status: DC | PRN
  Administered 2012-11-25: 5 ug via INTRAVENOUS
  Administered 2012-11-25: 30 ug via INTRAVENOUS
  Administered 2012-11-25: 10 ug via INTRAVENOUS
  Administered 2012-11-25 (×3): 20 ug via INTRAVENOUS
  Administered 2012-11-25: 10 ug via INTRAVENOUS
  Administered 2012-11-25 (×2): 30 ug via INTRAVENOUS
  Administered 2012-11-25: 5 ug via INTRAVENOUS
  Administered 2012-11-25: 40 ug via INTRAVENOUS

## 2012-11-25 MED ORDER — ASPIRIN 81 MG PO CHEW: 324.0000 mg | CHEWABLE_TABLET | Freq: Every day | ORAL | Status: DC

## 2012-11-25 MED ORDER — ACETAMINOPHEN 10 MG/ML IV SOLN
1000.0000 mg | Freq: Once | INTRAVENOUS | Status: AC
  Administered 2012-11-25: 1000 mg via INTRAVENOUS
  Filled 2012-11-25: qty 100

## 2012-11-25 MED ORDER — FAMOTIDINE IN NACL 20-0.9 MG/50ML-% IV SOLN
20.0000 mg | Freq: Two times a day (BID) | INTRAVENOUS | Status: DC
  Administered 2012-11-25: 20 mg via INTRAVENOUS
  Filled 2012-11-25: qty 50

## 2012-11-25 MED ORDER — LACTATED RINGERS IV SOLN
INTRAVENOUS | Status: DC
  Administered 2012-11-25 (×2): via INTRAVENOUS

## 2012-11-25 MED ORDER — THROMBIN 20000 UNITS EX SOLR
CUTANEOUS | Status: AC
  Filled 2012-11-25: qty 20000

## 2012-11-25 MED ORDER — METOCLOPRAMIDE HCL 5 MG/ML IJ SOLN
10.0000 mg | Freq: Four times a day (QID) | INTRAMUSCULAR | Status: AC
  Administered 2012-11-25 – 2012-11-26 (×3): 10 mg via INTRAVENOUS
  Filled 2012-11-25 (×3): qty 2

## 2012-11-25 MED ORDER — ACETAMINOPHEN 500 MG PO TABS
1000.0000 mg | ORAL_TABLET | Freq: Four times a day (QID) | ORAL | Status: DC
  Administered 2012-11-25 – 2012-11-29 (×14): 1000 mg via ORAL
  Filled 2012-11-25 (×18): qty 2

## 2012-11-25 MED ORDER — BISACODYL 5 MG PO TBEC
10.0000 mg | DELAYED_RELEASE_TABLET | Freq: Every day | ORAL | Status: DC
  Administered 2012-11-26 – 2012-11-28 (×2): 10 mg via ORAL
  Filled 2012-11-25 (×2): qty 2

## 2012-11-25 MED ORDER — SODIUM CHLORIDE 0.45 % IV SOLN
INTRAVENOUS | Status: DC
  Administered 2012-11-25: 15:00:00 via INTRAVENOUS

## 2012-11-25 MED ORDER — ALBUMIN HUMAN 5 % IV SOLN
INTRAVENOUS | Status: DC | PRN
  Administered 2012-11-25: 13:00:00 via INTRAVENOUS

## 2012-11-25 MED ORDER — PHENYLEPHRINE HCL 10 MG/ML IJ SOLN: 0.0000 ug/min | INTRAVENOUS | Status: DC

## 2012-11-25 MED ORDER — GLYCOPYRROLATE 0.2 MG/ML IJ SOLN
INTRAMUSCULAR | Status: DC | PRN
  Administered 2012-11-25: 0.2 mg via INTRAVENOUS

## 2012-11-25 MED ORDER — THROMBIN 20000 UNITS EX SOLR
OROMUCOSAL | Status: DC | PRN
  Administered 2012-11-25: 10:00:00 via TOPICAL

## 2012-11-25 MED ORDER — POTASSIUM CHLORIDE 10 MEQ/50ML IV SOLN: 10.0000 meq | INTRAVENOUS | Status: AC

## 2012-11-25 MED ORDER — ROCURONIUM BROMIDE 100 MG/10ML IV SOLN
INTRAVENOUS | Status: DC | PRN
  Administered 2012-11-25: 30 mg via INTRAVENOUS
  Administered 2012-11-25: 40 mg via INTRAVENOUS
  Administered 2012-11-25: 20 mg via INTRAVENOUS
  Administered 2012-11-25: 60 mg via INTRAVENOUS
  Administered 2012-11-25: 30 mg via INTRAVENOUS

## 2012-11-25 MED ORDER — THROMBIN 20000 UNITS EX KIT
PACK | CUTANEOUS | Status: DC | PRN
  Administered 2012-11-25: 20000 [IU] via TOPICAL

## 2012-11-25 MED ORDER — CHLORHEXIDINE GLUCONATE 4 % EX LIQD: 30.0000 mL | CUTANEOUS | Status: DC

## 2012-11-25 MED ORDER — MORPHINE SULFATE 2 MG/ML IJ SOLN: 1.0000 mg | INTRAMUSCULAR | Status: AC | PRN

## 2012-11-25 MED ORDER — DOCUSATE SODIUM 100 MG PO CAPS
200.0000 mg | ORAL_CAPSULE | Freq: Every day | ORAL | Status: DC
  Administered 2012-11-26 – 2012-11-28 (×2): 200 mg via ORAL
  Filled 2012-11-25 (×3): qty 2

## 2012-11-25 MED ORDER — PROPOFOL 10 MG/ML IV BOLUS
INTRAVENOUS | Status: DC | PRN
  Administered 2012-11-25: 70 mg via INTRAVENOUS

## 2012-11-25 MED ORDER — LACTATED RINGERS IV SOLN
INTRAVENOUS | Status: DC | PRN
  Administered 2012-11-25 (×2): via INTRAVENOUS

## 2012-11-25 MED ORDER — DEXMEDETOMIDINE HCL IN NACL 200 MCG/50ML IV SOLN
0.1000 ug/kg/h | INTRAVENOUS | Status: DC
  Administered 2012-11-25: 0.5 ug/kg/h via INTRAVENOUS
  Filled 2012-11-25: qty 50

## 2012-11-25 MED ORDER — SODIUM CHLORIDE 0.9 % IV SOLN: 250.0000 mL | INTRAVENOUS | Status: DC | PRN

## 2012-11-25 MED ORDER — ONDANSETRON HCL 4 MG/2ML IJ SOLN: 4.0000 mg | Freq: Four times a day (QID) | INTRAMUSCULAR | Status: DC | PRN

## 2012-11-25 MED ORDER — PANTOPRAZOLE SODIUM 40 MG PO TBEC
40.0000 mg | DELAYED_RELEASE_TABLET | Freq: Every day | ORAL | Status: DC
  Administered 2012-11-27 – 2012-11-29 (×3): 40 mg via ORAL
  Filled 2012-11-25 (×3): qty 1

## 2012-11-25 MED ORDER — MORPHINE SULFATE 2 MG/ML IJ SOLN
2.0000 mg | INTRAMUSCULAR | Status: DC | PRN
Start: 2012-11-26 — End: 2012-11-27
  Administered 2012-11-25 – 2012-11-26 (×3): 2 mg via INTRAVENOUS
  Filled 2012-11-25 (×3): qty 1

## 2012-11-25 SURGICAL SUPPLY — 96 items
ADAPTER CARDIO PERF ANTE/RETRO (ADAPTER) IMPLANT
ATTRACTOMAT 16X20 MAGNETIC DRP (DRAPES) ×2 IMPLANT
BAG DECANTER FOR FLEXI CONT (MISCELLANEOUS) ×2 IMPLANT
BANDAGE ELASTIC 4 VELCRO ST LF (GAUZE/BANDAGES/DRESSINGS) ×2 IMPLANT
BANDAGE ELASTIC 6 VELCRO ST LF (GAUZE/BANDAGES/DRESSINGS) ×2 IMPLANT
BANDAGE GAUZE ELAST BULKY 4 IN (GAUZE/BANDAGES/DRESSINGS) ×2 IMPLANT
BLADE CORE FAN STRYKER (BLADE) ×2 IMPLANT
BLADE SURG 11 STRL SS (BLADE) ×2 IMPLANT
BLADE SURG ROTATE 9660 (MISCELLANEOUS) ×2 IMPLANT
CANISTER SUCTION 2500CC (MISCELLANEOUS) ×2 IMPLANT
CANNULA ARTERIAL NVNT 3/8 22FR (MISCELLANEOUS) ×2 IMPLANT
CANNULA GUNDRY RCSP 15FR (MISCELLANEOUS) ×2 IMPLANT
CARDIOPLEGIA INFUSION (MISCELLANEOUS) ×2 IMPLANT
CATH ROBINSON RED A/P 18FR (CATHETERS) ×6 IMPLANT
CATH THORACIC 28FR (CATHETERS) ×2 IMPLANT
CATH THORACIC 36FR (CATHETERS) ×2 IMPLANT
CATH THORACIC 36FR RT ANG (CATHETERS) ×2 IMPLANT
CLIP FOGARTY SPRING 6M (CLIP) IMPLANT
CLIP RETRACTION 3.0MM CORONARY (MISCELLANEOUS) ×2 IMPLANT
CLIP TI MEDIUM 24 (CLIP) IMPLANT
CLIP TI WIDE RED SMALL 24 (CLIP) IMPLANT
CLOTH BEACON ORANGE TIMEOUT ST (SAFETY) ×2 IMPLANT
CLSR STERI-STRIP ANTIMIC 1/2X4 (GAUZE/BANDAGES/DRESSINGS) ×2 IMPLANT
COVER SURGICAL LIGHT HANDLE (MISCELLANEOUS) ×4 IMPLANT
CRADLE DONUT ADULT HEAD (MISCELLANEOUS) ×2 IMPLANT
DRAPE CARDIOVASCULAR INCISE (DRAPES) ×1
DRAPE SLUSH MACHINE 52X66 (DRAPES) IMPLANT
DRAPE SLUSH/WARMER DISC (DRAPES) ×2 IMPLANT
DRAPE SRG 135X102X78XABS (DRAPES) ×1 IMPLANT
DRSG COVADERM 4X14 (GAUZE/BANDAGES/DRESSINGS) ×2 IMPLANT
ELECT BLADE 6.5 EXT (BLADE) ×2 IMPLANT
ELECT CAUTERY BLADE 6.4 (BLADE) ×2 IMPLANT
ELECT REM PT RETURN 9FT ADLT (ELECTROSURGICAL) ×4
ELECTRODE REM PT RTRN 9FT ADLT (ELECTROSURGICAL) ×2 IMPLANT
GLOVE BIO SURGEON STRL SZ 6 (GLOVE) ×4 IMPLANT
GLOVE BIO SURGEON STRL SZ 6.5 (GLOVE) IMPLANT
GLOVE BIO SURGEON STRL SZ7 (GLOVE) IMPLANT
GLOVE BIO SURGEON STRL SZ7.5 (GLOVE) IMPLANT
GLOVE BIOGEL PI IND STRL 6 (GLOVE) ×4 IMPLANT
GLOVE BIOGEL PI IND STRL 6.5 (GLOVE) ×2 IMPLANT
GLOVE BIOGEL PI IND STRL 7.0 (GLOVE) ×5 IMPLANT
GLOVE BIOGEL PI INDICATOR 6 (GLOVE) ×4
GLOVE BIOGEL PI INDICATOR 6.5 (GLOVE) ×2
GLOVE BIOGEL PI INDICATOR 7.0 (GLOVE) ×5
GLOVE EUDERMIC 7 POWDERFREE (GLOVE) ×4 IMPLANT
GLOVE ORTHO TXT STRL SZ7.5 (GLOVE) IMPLANT
GOWN PREVENTION PLUS XLARGE (GOWN DISPOSABLE) ×4 IMPLANT
GOWN STRL NON-REIN LRG LVL3 (GOWN DISPOSABLE) ×14 IMPLANT
HEMOSTAT POWDER SURGIFOAM 1G (HEMOSTASIS) ×6 IMPLANT
HEMOSTAT SURGICEL 2X14 (HEMOSTASIS) ×2 IMPLANT
INSERT FOGARTY 61MM (MISCELLANEOUS) IMPLANT
INSERT FOGARTY XLG (MISCELLANEOUS) IMPLANT
KIT BASIN OR (CUSTOM PROCEDURE TRAY) ×2 IMPLANT
KIT CATH CPB BARTLE (MISCELLANEOUS) ×2 IMPLANT
KIT ROOM TURNOVER OR (KITS) ×2 IMPLANT
KIT SUCTION CATH 14FR (SUCTIONS) ×2 IMPLANT
KIT VASOVIEW W/TROCAR VH 2000 (KITS) ×2 IMPLANT
NS IRRIG 1000ML POUR BTL (IV SOLUTION) ×12 IMPLANT
PACK OPEN HEART (CUSTOM PROCEDURE TRAY) ×2 IMPLANT
PAD ARMBOARD 7.5X6 YLW CONV (MISCELLANEOUS) ×4 IMPLANT
PAD DEFIB R2 (MISCELLANEOUS) ×2 IMPLANT
PENCIL BUTTON HOLSTER BLD 10FT (ELECTRODE) ×2 IMPLANT
PUNCH AORTIC ROTATE 4.0MM (MISCELLANEOUS) IMPLANT
PUNCH AORTIC ROTATE 4.5MM 8IN (MISCELLANEOUS) ×2 IMPLANT
PUNCH AORTIC ROTATE 5MM 8IN (MISCELLANEOUS) IMPLANT
SET CARDIOPLEGIA MPS 5001102 (MISCELLANEOUS) ×2 IMPLANT
SPONGE GAUZE 4X4 12PLY (GAUZE/BANDAGES/DRESSINGS) ×4 IMPLANT
SPONGE INTESTINAL PEANUT (DISPOSABLE) IMPLANT
SPONGE LAP 18X18 X RAY DECT (DISPOSABLE) ×2 IMPLANT
SPONGE LAP 4X18 X RAY DECT (DISPOSABLE) ×2 IMPLANT
SUT BONE WAX W31G (SUTURE) ×2 IMPLANT
SUT MNCRL AB 4-0 PS2 18 (SUTURE) ×2 IMPLANT
SUT PROLENE 3 0 SH DA (SUTURE) IMPLANT
SUT PROLENE 3 0 SH1 36 (SUTURE) ×2 IMPLANT
SUT PROLENE 4 0 RB 1 (SUTURE) ×3
SUT PROLENE 4-0 RB1 .5 CRCL 36 (SUTURE) ×3 IMPLANT
SUT PROLENE 7 0 BV1 MDA (SUTURE) ×4 IMPLANT
SUT PROLENE BLUE 7 0 (SUTURE) IMPLANT
SUT STEEL SZ 6 DBL 3X14 BALL (SUTURE) ×6 IMPLANT
SUT VIC AB 1 CTX 36 (SUTURE) ×3
SUT VIC AB 1 CTX36XBRD ANBCTR (SUTURE) ×3 IMPLANT
SUT VIC AB 2-0 CT1 27 (SUTURE) ×1
SUT VIC AB 2-0 CT1 TAPERPNT 27 (SUTURE) ×1 IMPLANT
SUT VICRYL 2 TP 1 (SUTURE) ×2 IMPLANT
SUTURE E-PAK OPEN HEART (SUTURE) ×2 IMPLANT
SYSTEM SAHARA CHEST DRAIN ATS (WOUND CARE) ×2 IMPLANT
TAPE CLOTH SURG 4X10 WHT LF (GAUZE/BANDAGES/DRESSINGS) ×2 IMPLANT
TAPE PAPER 2X10 WHT MICROPORE (GAUZE/BANDAGES/DRESSINGS) ×2 IMPLANT
TOWEL OR 17X24 6PK STRL BLUE (TOWEL DISPOSABLE) ×2 IMPLANT
TOWEL OR 17X26 10 PK STRL BLUE (TOWEL DISPOSABLE) ×2 IMPLANT
TRAY FOLEY IC TEMP SENS 14FR (CATHETERS) ×2 IMPLANT
TUBE SUCT INTRACARD DLP 20F (MISCELLANEOUS) ×4 IMPLANT
TUBING INSUFFLATION 10FT LAP (TUBING) ×2 IMPLANT
UNDERPAD 30X30 INCONTINENT (UNDERPADS AND DIAPERS) ×2 IMPLANT
WATER STERILE IRR 1000ML POUR (IV SOLUTION) ×4 IMPLANT
YANKAUER SUCT BULB TIP NO VENT (SUCTIONS) ×2 IMPLANT

## 2012-11-25 NOTE — Progress Notes (Signed)
Resp made aware that pt is ready to begin weaning from ventilator

## 2012-11-25 NOTE — Plan of Care (Signed)
Problem: Phase I Progression Outcomes Goal: Point person for discharge identified Outcome: Completed/Met Date Met:  11/25/12 Wife and daughter

## 2012-11-25 NOTE — Brief Op Note (Signed)
11/25/2012  12:04 PM  PATIENT:  Cody Hobbs  78 y.o. male  PRE-OPERATIVE DIAGNOSIS:  CAD  POST-OPERATIVE DIAGNOSIS:  CAD  PROCEDURE:   REDO CORONARY ARTERY BYPASS GRAFTING x 3 (SVG-OM2, SVG-PD-PL), EVH Left thigh  SURGEON:  Surgeon(s): Alleen Borne, MD  ASSISTANT: Coral Ceo, PA-C  ANESTHESIA:   general  PATIENT CONDITION:  ICU - intubated and hemodynamically stable.  PRE-OPERATIVE WEIGHT: 101 kg

## 2012-11-25 NOTE — Anesthesia Preprocedure Evaluation (Addendum)
Anesthesia Evaluation  Patient identified by MRN, date of birth, ID band Patient awake    Reviewed: Allergy & Precautions  History of Anesthesia Complications Negative for: history of anesthetic complications  Airway Mallampati: II TM Distance: >3 FB Neck ROM: Full    Dental  (+) Edentulous Upper and Edentulous Lower   Pulmonary neg pulmonary ROS,  breath sounds clear to auscultation  Pulmonary exam normal       Cardiovascular hypertension, Pt. on medications and Pt. on home beta blockers + angina + CAD Rhythm:Regular Rate:Normal     Neuro/Psych negative neurological ROS     GI/Hepatic negative GI ROS,   Endo/Other  negative endocrine ROS  Renal/GU negative Renal ROS     Musculoskeletal   Abdominal Normal abdominal exam  (+) + obese,   Peds  Hematology negative hematology ROS (+)   Anesthesia Other Findings   Reproductive/Obstetrics                          Anesthesia Physical Anesthesia Plan  ASA: IV  Anesthesia Plan: General   Post-op Pain Management:    Induction:   Airway Management Planned: Oral ETT  Additional Equipment: CVP, Arterial line, PA Cath, TEE and Ultrasound Guidance Line Placement  Intra-op Plan:   Post-operative Plan: Post-operative intubation/ventilation  Informed Consent: I have reviewed the patients History and Physical, chart, labs and discussed the procedure including the risks, benefits and alternatives for the proposed anesthesia with the patient or authorized representative who has indicated his/her understanding and acceptance.     Plan Discussed with: CRNA and Anesthesiologist  Anesthesia Plan Comments:        Anesthesia Quick Evaluation

## 2012-11-25 NOTE — Anesthesia Postprocedure Evaluation (Signed)
  Anesthesia Post-op Note  Patient: Cody Hobbs  Procedure(s) Performed: Procedure(s) (LRB) with comments: REDO CORONARY ARTERY BYPASS GRAFTING (CABG) (N/A)  Patient Location: PACU and SICU  Anesthesia Type:General  Level of Consciousness: Patient remains intubated per anesthesia plan  Airway and Oxygen Therapy: Patient placed on Ventilator (see vital sign flow sheet for setting)  Post-op Pain: mild  Post-op Assessment: Post-op Vital signs reviewed  Post-op Vital Signs: Reviewed  Complications: No apparent anesthesia complications

## 2012-11-25 NOTE — Procedures (Signed)
Extubation Procedure Note  Patient Details:   Name: Cody Hobbs DOB: 28-Nov-1934 MRN: [id number]   Airway Documentation:  Airway 8.5 mm (Active)  Secured at (cm) 25 cm 11/25/2012  7:28 PM  Measured From Lips 11/25/2012  7:28 PM  Secured Location Right 11/25/2012  7:28 PM  Secured By Pink Tape 11/25/2012  7:28 PM  Site Condition Dry 11/25/2012  7:28 PM    Evaluation  O2 sats: stable throughout Complications: No apparent complications Patient did tolerate procedure well. Bilateral Breath Sounds: Clear Suctioning: Airway Yes   Pt tolerated procedure wel after gas was drawn and nif and fev within range. Pt able to speak after and was placed on a 4lpm [place].  Lashaundra Lehrmann 11/25/2012, 8:08 PM

## 2012-11-25 NOTE — Interval H&P Note (Signed)
History and Physical Interval Note:  11/25/2012 7:47 AM  Cody Hobbs  has presented today for surgery, with the diagnosis of CAD  The various methods of treatment have been discussed with the patient and family. After consideration of risks, benefits and other options for treatment, the patient has consented to  Procedure(s) (LRB) with comments: REDO CORONARY ARTERY BYPASS GRAFTING (CABG) (N/A) as a surgical intervention .  The patient's history has been reviewed, patient examined, no change in status, stable for surgery.  I have reviewed the patient's chart and labs.  Questions were answered to the patient's satisfaction.     Alleen Borne

## 2012-11-25 NOTE — H&P (Signed)
[address]            Cody Hobbs [address]          [phone]       PCP is Louie Boston, MD  Referring Provider is Lesleigh Noe, MD  Chief Complaint   Patient presents with   .  Coronary Artery Disease     cardiac cath 10/30/12, eval for re-do of CABG, HX of CABG 1995    HPI:   The patient is a 78 year old who underwent coronary bypass graft surgery x4 by me in November of 1995. He had a left internal mammary graft to the LAD with a saphenous vein graft to the second obtuse marginal branch and a sequential saphenous vein graft to the posterior descending branch of the right coronary artery and the posterior lateral branch of the left circumflex coronary artery. He subsequently had a PCI with proximal and distal stents placed in the right coronary vein graft in 2008 or 2009. He was recently seen in annual followup by Dr. Katrinka Blazing and related that he had had one episode of substernal chest discomfort with heavy exertion while he was deer hunting. He said this resolved spontaneously. He denies any other episodes of chest discomfort or shortness of breath. He had an early positive exercise treadmill test with stress images showing a moderate to severe perfusion defect due to infarct or scar with mild peri-infarct ischemia in the basal inferoseptal, basal inferior, and mid inferior regions. There was a mild perfusion defect due to infarct or scar with moderate peri-infarct ischemia in the basal anterolateral, mid inferior lateral, mid anterolateral, and apical lateral regions. There was a mild perfusion defect due to infarct or scar with mild peri-infarct ischemia in the basal anterior, basal anterolateral, and mid anterior regions. The post-stress ejection fraction is 59%. The stress ECG showed ischemic changes from the baseline ECG. He subsequently underwent cardiac catheterization on 10/30/2012 which showed occlusion of the 2 previous saphenous vein grafts.  There is a widely patent left internal mammary graft to the LAD. There is severe native vessel disease with total occlusion of the right coronary artery, LAD, and ostial and distal native left main disease up to 80%. The second obtuse marginal branch was the dominant left circumflex branch and was diffusely diseased.  Past Medical History   Diagnosis  Date   .  CAD (coronary artery disease)    .  Hypertension    .  Hyperlipidemia     Past Surgical History   Procedure  Date   .  Tonsillectomy    .  Vasectomy    .  Cabg with lima to lad, svg to om2 and sequential svg to pda and distal circumflex  1995   .  Coronary angioplasty with stent placement  2009    Family History   Problem  Relation  Age of Onset   .  Heart attack  Father    .  Stomach cancer  Mother    .  Heart attack  Brother    .  Breast cancer  Sister    Social History  History   Substance Use Topics   .  Smoking status:  Former Smoker     Types:  Cigarettes     Start date:  11/04/1992   .  Smokeless tobacco:  Never Used   .  Alcohol Use:  Not on file    Current Outpatient Prescriptions   Medication  Sig  Dispense  Refill   .  aspirin EC 81 MG tablet  Take 81 mg by mouth daily.     Marland Kitchen  atenolol (TENORMIN) 50 MG tablet  Take 50 mg by mouth daily.     .  simvastatin (ZOCOR) 40 MG tablet  Take 40 mg by mouth every evening.      Allergies   Allergen  Reactions   .  Penicillins  Itching   Review of Systems  Constitutional: Negative for fever, chills, diaphoresis, activity change, appetite change, fatigue and unexpected weight change.  HENT: Negative.  Eyes: Negative.  Respiratory: Negative.  Cardiovascular: Positive for chest pain. Negative for palpitations and leg swelling.  Once  Gastrointestinal: Negative.  Genitourinary: Positive for frequency.  Musculoskeletal: Negative.  Neurological: Negative.  Hematological: Bruises/bleeds easily.  Psychiatric/Behavioral: Negative.  BP 142/84  Pulse 76  Resp 20  Ht 6'  4" (1.93 m)  Wt 220 lb (99.791 kg)  BMI 26.78 kg/m2  SpO2 95%  Physical Exam  Constitutional: He is oriented to person, place, and time. He appears well-developed and well-nourished. No distress.  HENT:  Head: Normocephalic and atraumatic.  Mouth/Throat: Oropharynx is clear and moist.  Eyes: Conjunctivae normal and EOM are normal. Pupils are equal, round, and reactive to light.  Neck: Normal range of motion. Neck supple. No JVD present. No thyromegaly present.  Cardiovascular: Normal rate, regular rhythm, normal heart sounds and intact distal pulses. Exam reveals no gallop and no friction rub.  No murmur heard.  Pulmonary/Chest: Effort normal and breath sounds normal. No respiratory distress.  Abdominal: Soft. Bowel sounds are normal. He exhibits no distension and no mass. There is no tenderness.  Musculoskeletal: Normal range of motion. He exhibits no edema.  Neurological: He is alert and oriented to person, place, and time. He has normal strength. No cranial nerve deficit or sensory deficit.  Skin: Skin is warm and dry.  Psychiatric: He has a normal mood and affect.  Cardiac Cath:  HEMODYNAMICS: Aortic pressure was 152/71 mmHg; LV pressure was 157/3 mmHg; LVEDP 13 mm mercury. There was no gradient between the left ventricle and aorta.  ANGIOGRAPHIC DATA: The left main coronary artery is 50% ostial and 80% distal. Heavy calcification is noted..  The left anterior descending artery is totally occluded at the left main distal ostium.  The left circumflex artery is heavily calcified. 3 obtuse marginal branches are noted. The second obtuse marginal is a dominant branch. It is a site of prior grafting. This vessel is diffusely diseased and 10 to anteriorly by the previous graft the first and third obtuse marginal branches are small and probably not graftable. The distal circumflex flex supplies collaterals to the distal right coronary  The right coronary artery is heavily calcified and totally  occluded in the proximal vessel. The distal right coronary supplied by left coronary collaterals via the circumflex . Collaterals are not identified connecting the left anterior descending to the right coronary territory  LEFT VENTRICULOGRAM: Left ventricular angiogram was done in the 30 RAO projection and revealed normal left ventricular wall motion and systolic function with an estimated ejection fraction of 50 %.  BYPASS GRAFT ANGIOGRAPHY:  SVG to obtuse marginal #1: Totally occluded  SVG sequential to PDA and PLA: Totally occluded  IMPRESSIONS: 1. Saphenous vein graft failure with occlusion of the sequential saphenous vein graft to the PDA/PLA, and total occlusion of the SVG to the circumflex.  2. Widely patent left internal mammary artery graft to the LAD  3. Severe native vessel disease with total occlusion of the right coronary artery, LAD, and ostial and distal native left main disease up to 80%. The second obtuse marginal which is dominant circumflex branch is diffusely diseased and was assigned a prior grafting .  4. Overall normal left ventricular function EF 50%  RECOMMENDATION: 1. Consult Dr. Evelene Croon for consideration of repeat coronary bypass graft.   Impression/plan:   He has severe left main and native three-vessel coronary disease with an occluded right coronary artery that is being supplied distally by collaterals from the left circumflex coronary artery. Therefore the left main disease puts the left circumflex and right coronary territories at risk. The second obtuse marginal branch is diffusely diseased and may not be graftable. I think the posterior descending and posterior lateral branches looked like they should be graftable which would supply some degree of protection against occlusion of the left main coronary. He is still quite active at 78 years old and in fairly good shape so I think redo coronary bypass graft surgery is probably the best alternative for him. I  reviewed the catheterization findings with the patient and his wife. All of their questions were answered. I discussed the operative procedure with the patient and wife including alternatives, benefits and risks; including but not limited to bleeding, blood transfusion, infection, stroke, myocardial infarction, graft failure, heart block requiring a permanent pacemaker, organ dysfunction, and death.  Cody Hobbs understands and agrees to proceed.

## 2012-11-25 NOTE — Anesthesia Procedure Notes (Signed)
Procedures Procedures: Right IJ Theone Murdoch Catheter Insertion: [id number]: The patient was identified and consent obtained.  TO was performed, and full barrier precautions were used.  The skin was anesthetized with lidocaine-4cc plain with 25g needle.  Once the vein was located with the 22 ga. needle using ultrasound guidance , the wire was inserted into the vein.  The wire location was confirmed with ultrasound.  The tissue was dilated and the 8.5 [place] cordis catheter was carefully inserted. Afterwards Theone Murdoch catheter was inserted. PA catheter at 55cm.  The patient tolerated the procedure well.   CE

## 2012-11-25 NOTE — Transfer of Care (Signed)
Immediate Anesthesia Transfer of Care Note  Patient: Cody Hobbs  Procedure(s) Performed: Procedure(s) (LRB) with comments: REDO CORONARY ARTERY BYPASS GRAFTING (CABG) (N/A)  Patient Location: SICU  Anesthesia Type:General  Level of Consciousness: Patient remains intubated per anesthesia plan  Airway & Oxygen Therapy: Patient remains intubated per anesthesia plan and Patient placed on Ventilator (see vital sign flow sheet for setting)  Post-op Assessment: Report given to PACU RN and Post -op Vital signs reviewed and stable  Post vital signs: Reviewed and stable  Complications: No apparent anesthesia complications

## 2012-11-26 ENCOUNTER — Inpatient Hospital Stay (HOSPITAL_COMMUNITY): Payer: Medicare Other

## 2012-11-26 LAB — CBC
Hemoglobin: 11.5 g/dL — ABNORMAL LOW (ref 13.0–17.0)
MCH: 32 pg (ref 26.0–34.0)
MCHC: 34.3 g/dL (ref 30.0–36.0)
MCHC: 34.7 g/dL (ref 30.0–36.0)
MCV: 93.2 fL (ref 78.0–100.0)
Platelets: 116 10*3/uL — ABNORMAL LOW (ref 150–400)
Platelets: 115 10*3/uL — ABNORMAL LOW (ref 150–400)
RBC: 3.69 MIL/uL — ABNORMAL LOW (ref 4.22–5.81)
RDW: 13 % (ref 11.5–15.5)

## 2012-11-26 LAB — POCT I-STAT, CHEM 8
BUN: 13 mg/dL (ref 6–23)
Calcium, Ion: 1.19 mmol/L (ref 1.13–1.30)
TCO2: 25 mmol/L (ref 0–100)

## 2012-11-26 LAB — CREATININE, SERUM
Creatinine, Ser: 0.93 mg/dL (ref 0.50–1.35)
GFR calc Af Amer: >90 mL/min (ref >90)
GFR calc non Af Amer: 80 mL/min — ABNORMAL LOW (ref >90)

## 2012-11-26 LAB — BASIC METABOLIC PANEL
Calcium: 8.2 mg/dL — ABNORMAL LOW (ref 8.4–10.5)
GFR calc non Af Amer: 83 mL/min — ABNORMAL LOW (ref >90)
Sodium: 140 mEq/L (ref 135–145)

## 2012-11-26 LAB — GLUCOSE, CAPILLARY
Glucose-Capillary: 125 mg/dL — ABNORMAL HIGH (ref 70–99)
Glucose-Capillary: 122 mg/dL — ABNORMAL HIGH (ref 70–99)
Glucose-Capillary: 113 mg/dL — ABNORMAL HIGH (ref 70–99)
Glucose-Capillary: 112 mg/dL — ABNORMAL HIGH (ref 70–99)
Glucose-Capillary: 107 mg/dL — ABNORMAL HIGH (ref 70–99)
Glucose-Capillary: 156 mg/dL — ABNORMAL HIGH (ref 70–99)

## 2012-11-26 LAB — MAGNESIUM
Magnesium: 2.3 mg/dL (ref 1.5–2.5)
Magnesium: 1.9 mg/dL (ref 1.5–2.5)

## 2012-11-26 MED ORDER — POTASSIUM CHLORIDE CRYS ER 20 MEQ PO TBCR
20.0000 meq | EXTENDED_RELEASE_TABLET | Freq: Two times a day (BID) | ORAL | Status: DC
  Administered 2012-11-26 – 2012-11-29 (×7): 20 meq via ORAL
  Filled 2012-11-26 (×8): qty 1

## 2012-11-26 MED ORDER — INSULIN DETEMIR 100 UNIT/ML ~~LOC~~ SOLN
15.0000 [IU] | Freq: Every day | SUBCUTANEOUS | Status: DC
  Administered 2012-11-26 – 2012-11-27 (×2): 15 [IU] via SUBCUTANEOUS

## 2012-11-26 MED ORDER — INSULIN ASPART 100 UNIT/ML ~~LOC~~ SOLN
0.0000 [IU] | SUBCUTANEOUS | Status: DC
  Administered 2012-11-26 (×3): 2 [IU] via SUBCUTANEOUS

## 2012-11-26 MED ORDER — FUROSEMIDE 20 MG PO TABS
20.0000 mg | ORAL_TABLET | Freq: Two times a day (BID) | ORAL | Status: DC
  Administered 2012-11-26 – 2012-11-28 (×4): 20 mg via ORAL
  Filled 2012-11-26 (×7): qty 1

## 2012-11-26 MED ORDER — INSULIN ASPART 100 UNIT/ML ~~LOC~~ SOLN
0.0000 [IU] | SUBCUTANEOUS | Status: DC
  Administered 2012-11-27: 2 [IU] via SUBCUTANEOUS

## 2012-11-26 MED ORDER — FERROUS SULFATE 325 (65 FE) MG PO TABS
325.0000 mg | ORAL_TABLET | Freq: Two times a day (BID) | ORAL | Status: DC
  Filled 2012-11-26: qty 1

## 2012-11-26 MED FILL — Sodium Chloride IV Soln 0.9%: INTRAVENOUS | Qty: 1000 | Status: AC

## 2012-11-26 MED FILL — Lidocaine HCl IV Inj 20 MG/ML: INTRAVENOUS | Qty: 5 | Status: AC

## 2012-11-26 MED FILL — Magnesium Sulfate Inj 50%: INTRAMUSCULAR | Qty: 10 | Status: AC

## 2012-11-26 MED FILL — Sodium Chloride Irrigation Soln 0.9%: Qty: 3000 | Status: AC

## 2012-11-26 MED FILL — Sodium Bicarbonate IV Soln 8.4%: INTRAVENOUS | Qty: 50 | Status: AC

## 2012-11-26 MED FILL — Mannitol IV Soln 20%: INTRAVENOUS | Qty: 500 | Status: AC

## 2012-11-26 MED FILL — Heparin Sodium (Porcine) Inj 1000 Unit/ML: INTRAMUSCULAR | Qty: 30 | Status: AC

## 2012-11-26 MED FILL — Electrolyte-R (PH 7.4) Solution: INTRAVENOUS | Qty: 5000 | Status: AC

## 2012-11-26 MED FILL — Heparin Sodium (Porcine) Inj 1000 Unit/ML: INTRAMUSCULAR | Qty: 10 | Status: AC

## 2012-11-26 MED FILL — Potassium Chloride Inj 2 mEq/ML: INTRAVENOUS | Qty: 40 | Status: AC

## 2012-11-26 NOTE — Progress Notes (Addendum)
1 Day Post-Op Procedure(s) (LRB): REDO CORONARY ARTERY BYPASS GRAFTING (CABG) (N/A) Subjective:  Mr. Raine has no specific complaints this morning.  Remains on insulin drip   Objective: Vital signs in last 24 hours: Temp:  [95.9 F (35.5 C)-98.4 F (36.9 C)] 98.2 F (36.8 C) (01/23 0700) Pulse Rate:  [76-84] 84  (01/23 0700) Cardiac Rhythm:  [-] Normal sinus rhythm (01/23 0400) Resp:  [11-24] 23  (01/23 0700) BP: (91-128)/(46-68) 126/63 mmHg (01/23 0700) SpO2:  [94 %-100 %] 96 % (01/23 0700) Arterial Line BP: (82-162)/(49-67) 159/61 mmHg (01/23 0700) FiO2 (%):  [40 %-50 %] 40 % (01/22 1928) Weight:  [222 lb 14.2 oz (101.1 kg)-235 lb 7.2 oz (106.8 kg)] 235 lb 7.2 oz (106.8 kg) (01/23 0400)  Hemodynamic parameters for last 24 hours: PAP: (20-33)/(10-19) 28/15 mmHg CO:  [4.5 L/min-8.2 L/min] 6.8 L/min CI:  [2 L/min/m2-3.6 L/min/m2] 3 L/min/m2  Intake/Output from previous day: 01/22 0701 - 01/23 0700 In: 6532.2 [P.O.:720; I.V.:4077.2; Blood:579; NG/GT:30; IV Piggyback:1126] Out: 4295 [Urine:2925; Blood:900; Chest Tube:470]  General appearance: alert, cooperative and no distress Heart: regular rate and rhythm Lungs: clear to auscultation bilaterally Abdomen: soft, non-tender; bowel sounds normal; no masses,  no organomegaly Extremities: edema trace Wound: clean and dry  Lab Results:  Basename 11/26/12 0400 11/25/12 1902 11/25/12 1440  WBC 11.2* -- 10.1  HGB 11.8* 11.9* --  HCT 34.4* 35.0* --  PLT 116* -- 92*   BMET:  Basename 11/26/12 0400 11/25/12 1902 11/23/12 1445  NA 140 144 --  K 4.1 4.3 --  CL 106 106 --  CO2 23 -- 20  GLUCOSE 129* 138* --  BUN 12 11 --  CREATININE 0.85 0.90 --  CALCIUM 8.2* -- 9.7    PT/INR:  Basename 11/25/12 1440  LABPROT 18.0*  INR 1.54*   ABG    Component Value Date/Time   PHART 7.308* 11/25/2012 2107   HCO3 22.3 11/25/2012 2107   TCO2 24 11/25/2012 2107   ACIDBASEDEF 4.0* 11/25/2012 2107   O2SAT 93.0 11/25/2012 2107   CBG  (last 3)   Basename 11/26/12 0700 11/26/12 0604 11/26/12 0512  GLUCAP 107* 112* 113*    Assessment/Plan: S/P Procedure(s) (LRB): REDO CORONARY ARTERY BYPASS GRAFTING (CABG) (N/A)  1. CV- NSR, off all cardiac gtts, turn pacer off- on Lopressor 2. Pulm- + atelectasis on CXR, wean oxygen as tolerated, continue IS 3. Renal- creatinine, lytes normal 4. Expected Blood Loss Anemia- mild 5. Volume Overload- weight is up 13 lbs, will start Lasix and Potassium 6. CBGs- moderately controlled, remains on insulin drip, will wean as able, start Levimir 7. Dispo- doing well for early post op, D/C Swan, Arterial Line, Chest tubes, foley   LOS: 1 day    Lowella Dandy 11/26/2012   patient examined and medical record reviewed,agree with above note. Will remove lines, diurese and mobilize OOB VAN TRIGT III,PETER 11/26/2012

## 2012-11-26 NOTE — Progress Notes (Signed)
Patient Name: Cody Hobbs Date of Encounter: 11/26/2012    SUBJECTIVE: Mac is sitting at the bedside. He is in no distress. Chest tubes and mediastinal tubes have been removed.  TELEMETRY:  Normal sinus rhythm all monitor.: Filed Vitals:   11/26/12 1500 11/26/12 1600 11/26/12 1609 11/26/12 1700  BP: 110/55 132/60  134/69  Pulse: 82 71  87  Temp:   97.8 F (36.6 C)   TempSrc:   Oral   Resp: 14 25  20   Height:      Weight:      SpO2: 92% 93%  94%    Intake/Output Summary (Last 24 hours) at 11/26/12 1747 Last data filed at 11/26/12 1700  Gross per 24 hour  Intake 1942.48 ml  Output   1835 ml  Net 107.48 ml    LABS: Basic Metabolic Panel:  Basename 11/26/12 1636 11/26/12 1633 11/26/12 0400  NA -- 138 140  K -- 3.9 4.1  CL -- 98 106  CO2 -- -- 23  GLUCOSE -- 151* 129*  BUN -- 13 12  CREATININE 0.93 1.00 --  CALCIUM -- -- 8.2*  MG 1.9 -- 2.3  PHOS -- -- --   CBC:  Basename 11/26/12 1636 11/26/12 1633 11/26/12 0400  WBC 12.9* -- 11.2*  NEUTROABS -- -- --  HGB 11.5* 10.9* --  HCT 33.1* 32.0* --  MCV 93.8 -- 93.2  PLT 115* -- 116*    Radiology/Studies:  Not reviewed  Physical Exam: Blood pressure 134/69, pulse 87, temperature 97.8 F (36.6 C), temperature source Oral, resp. rate 20, height 6\' 2"  (1.88 m), weight 106.8 kg (235 lb 7.2 oz), SpO2 94.00%. Weight change:    No rub or murmur is heard.  Decreased breath sounds at the bases.  Hand and lower extremity edema is noted.  ASSESSMENT:  1. Status post coronary artery bypass grafting, repeat surgery for circumflex and right coronary disease.  2. Hypertension currently well controlled  3. Diabetes mellitus being managed with sliding scale   Plan:  1. No new recommendations  2. Watch for arrhythmias  Signed, Lesleigh Noe 11/26/2012, 5:47 PM

## 2012-11-26 NOTE — Progress Notes (Signed)
Inpatient Diabetes Program Recommendations  AACE/ADA: New Consensus Statement on Inpatient Glycemic Control (2013)  Target Ranges:  Prepandial:   less than 140 mg/dL      Peak postprandial:   less than 180 mg/dL (1-2 hours)      Critically ill patients:  140 - 180 mg/dL   Reason for Visit: No previous history of diabetes noted.  A1C=6.7% (According to ADA, A1C>6.5% indicates diabetes).  MD please advise whether this is new diagnosis.  If so, patient will need follow-up with PCP and "[facility] [facility] Diabetes Education" while in the hospital.  Will follow.

## 2012-11-26 NOTE — Clinical Documentation Improvement (Signed)
Anemia Blood Loss Clarification  THIS DOCUMENT IS NOT A PERMANENT PART OF THE MEDICAL RECORD  RESPOND TO THE THIS QUERY, FOLLOW THE INSTRUCTIONS BELOW:  1. If needed, update documentation for the patient's encounter via the notes activity.  2. Access this query again and click edit on the In [employer].  3. After updating, or not, click F2 to complete all highlighted (required) fields concerning your review. Select "additional documentation in the medical record" OR "no additional documentation provided".  4. Click Sign note button.  5. The deficiency will fall out of your In Basket *Please let us know if you are not able to complete this workflow by phone or e-mail (listed below).        11/26/12  Dear Dr. Laneta Simmers Marton Redwood  In an effort to better capture your patient's severity of illness, reflect appropriate length of stay and utilization of resources, a review of the patient medical record has revealed the following indicators.    Based on your clinical judgment, please clarify and document in a progress note and/or discharge summary the clinical condition associated with the following supporting information:  In responding to this query please exercise your independent judgment.  The fact that a query is asked, does not imply that any particular answer is desired or expected.   Possible Clinical Conditions?   " Expected Acute Blood Loss Anemia  " Acute Blood Loss Anemia  " Acute on chronic blood loss anemia  " Precipitous drop in Hematocrit  " Other Condition  " Cannot Clinically Determine    Supporting Information: Expected blood loss anemia, mild, noted per 01/23 progress notes. EBL: per 01/22 Anesthesia record.  Diagnostics: H&H on 01/20:   17.1/47.7 H&H on 01/22:   10.2/30.0  Treatments: IV fluids / plasma expanders: Albumin, human 5%: per 01/22 Anesthesia record. Cell saver: per 01/22 Anesthesia record.   Reviewed: Additional  documentation provided per 01/26 progress notes.  Thank You,  Marciano Sequin,  Clinical Documentation Specialist:  Pager: [phone]  Phone: [phone]  Health Information Management [place]

## 2012-11-26 NOTE — Progress Notes (Signed)
Patient's radial arterial line with whipped waveform, reading 30-40 pts higher than BP cuff despite numerous flushes and repositioning of patient's arm.  Will use cuff to monitor BP.

## 2012-11-26 NOTE — Progress Notes (Signed)
[facility] BRIEF SICU PROGRESS NOTE  1 Day Post-Op  S/P Procedure(s) (LRB): REDO CORONARY ARTERY BYPASS GRAFTING (CABG) (N/A)   Stable day Maintaining NSR w/ stable BP O2 sats 95% on RA UOP adequate  Plan: Continue routine care  OWEN,CLARENCE H 11/26/2012 2:38 PM

## 2012-11-26 NOTE — Progress Notes (Signed)
Utilization Review Completed.  11/26/2012  

## 2012-11-26 NOTE — Op Note (Signed)
NAMEABEER, IVERSEN NO.:  [id number]  MEDICAL RECORD NO.:  [id number]  LOCATION:  2304                         FACILITY:  [facility]  PHYSICIAN:  Evelene Croon, M.D.     DATE OF BIRTH:  04-Jul-1935  DATE OF PROCEDURE:  11/25/2012 DATE OF DISCHARGE:                              OPERATIVE REPORT   PREOPERATIVE DIAGNOSIS:  Severe multivessel coronary artery disease with saphenous vein graft occlusion, status post coronary bypass graft surgery in 1995.  POSTOPERATIVE DIAGNOSIS:  Severe multivessel coronary artery disease with saphenous vein graft occlusion, status post coronary bypass graft surgery in 1995.  OPERATIVE PROCEDURE:  Redo median sternotomy, extracorporeal circulation, coronary artery bypass graft surgery x3 using a saphenous vein graft to the second obtuse marginal branch of the left circumflex coronary artery, and a sequential saphenous vein graft to the posterior descending and posterolateral branches of the right coronary artery. Endoscopic vein harvesting from the left leg.  ATTENDING SURGEON:  Evelene Croon, MD  ASSISTANT:  Coral Ceo, PA-C  ANESTHESIA:  General endotracheal.  CLINICAL HISTORY:  This patient is a 78 year old gentleman with history of hypertension, hyperlipidemia, as well as coronary artery disease, who underwent coronary bypass surgery x4 by me in November 1995.  At that time, he had a left internal mammary graft to the LAD with saphenous vein graft to the second obtuse marginal branch, as well as a sequential saphenous vein graft to the posterior descending and posterolateral branches of the right coronary artery.  He subsequently had a percutaneous intervention with proximal and distal stents placed in the right coronary vein graft in 2009.  His was recently seen in annual follow up by Dr. Katrinka Blazing and related that he had, had 1 episode of substernal chest discomfort with heavy exertion while he was deer hunting.  This resolved  spontaneously.  He had an early positive exercise treadmill test showing moderate-to-severe perfusion defect, due to infarct or scar with mild peri-infarct ischemia in the basal inferoseptal, basal inferior, and mid inferior regions.  There was also mild perfusion defect with peri-infarct ischemia in the basal anterolateral, mid inferolateral, and mid anterolateral, and apicolateral regions.  The patient also had a mild perfusion defect with peri-infarct ischemia in the basal anterior, basal anterolateral, and mid anterior walls.  The post-stress ejection fraction 59%.  Stress electrocardiogram showed ischemic changes from the baseline EKG.  He subsequently underwent cardiac catheterizations on October 30, 2012, and showed occlusion of the two previous saphenous vein grafts.  There is a widely patent left internal mammary graft to the LAD.  There was severe native left main disease with ostial and distal disease up to 80%.  The LAD was occluded at its origin and was filled by the left internal mammary graft.  The left circumflex system was diffusely diseased.  There was a second marginal branch which was the dominant branch and that was diffusely diseased.  I thought it might be graftable distally.  The right coronary artery was occluded proximally with filling of the posterior descending and posterolateral branches by collaterals from the left circumflex.  After review of the catheterization and on examination of the patient, it was felt that redo coronary artery bypass graft  surgery is the best treatment to prevent further ischemia and infarction and improve his quality of life.  I discussed the operative procedure with the patient and his wife including alternatives, benefits, and risks including, but not limited to, bleeding, blood transfusion, infection, stroke, myocardial infarction, graft failure, organ dysfunction, and death.  They understood and agreed to proceed.  Initially, I  am going to decide to proceed with surgery in April, but the patient had another episode of chest pain in the last week or two, and therefore they decided to move surgery up.  OPERATIVE PROCEDURE:  The patient was taken to the operating room, placed on table in supine position.  After induction of general endotracheal anesthesia, a Foley catheter was placed in the bladder using sterile technique.  Then, the chest, abdomen, and both lower extremities were prepped and draped in usual sterile manner.  Then, a segment of greater saphenous vein was harvested from the left leg using endoscopic vein harvest technique.  This vein was of medium to large caliber and fairly good quality with some thickening of the walls.  Then, the chest was opened through the previous median sternotomy incision.  Sternal wires were removed and the sternum opened using the oscillating saw without difficulty.  Bone hooks were used to retract the sternal edges and the heart was dissected from the back of the sternum. Then, the chest retractor was placed.  Dissection was performed to expose the right atrium and ascending aorta.  There were moderately dense adhesions.  The old right coronary vein graft was identified overlying the right ventricle.  Then, the patient was heparinized, an adequate ACT was obtained.  The distal ascending aorta was cannulated using a 22-French aortic cannula for arterial inflow.  Venous outflow was achieved using a two-stage venous cannula for the right atrial appendage.  An antegrade cardioplegia and vent cannula was inserted in the aortic root.  The retrograde cardioplegia cannula was inserted through a purse-string suture in the right and advanced in the coronary sinus.  The patient was then placed on cardiopulmonary bypass.  The remainder of the heart was dissected free from the pericardium.  The left internal mammary pedicle was identified as it entered the pericardium  laterally. It was carefully encircled.  Then, the patient was cooled to 32 degrees centigrade.  The aorta was crossclamped and 1000 mL of cold blood retrograde cardioplegia was administered in the aortic root with slow arrest of the heart.  This was followed by 1000 mL of cold blood antegrade cardioplegia directly into the aortic root.  This resulted in complete sinus arrest.  A temperature probe was placed in the septum and insulating pad was placed in the pericardium.  Then, the distal coronary arteries were identified.  The second obtuse marginal branch was diffusely diseased, but there was an area distally that was soft enough without plaque to graft.  The posterior descending and posterolateral branches were both moderate-sized vessels that were suitable for grafting with no significant distal disease in them.  Then, the first distal anastomosis was performed to the second marginal branch.  The internal diameter of this vessel was about 1.6 mm.  The conduit used was a segment of greater saphenous vein and anastomosis performed end-to-side manner using continuous 7-0 Prolene suture.  Flow was noted through the graft and was excellent.  Another dose of retrograde cardioplegia was given.  Second distal anastomosis was performed to the posterior descending coronary artery.  The internal diameter of this vessel was 1.6  mm.  The conduit used was a second segment of greater saphenous vein and anastomosis performed in a sequential side-to-side manner using continuous 7-0 Prolene suture.  Flow was noted through the graft and was excellent.  The third distal anastomosis was performed to the posterolateral branch on the right coronary artery.  The internal diameter was 1.6 mm.  The conduit used was a same segment of greater saphenous vein and anastomosis performed in a sequential end-to-side manner using continuous 7-0 Prolene suture.  Flow was noted through the graft and was excellent.   Then, another dose of retrograde and antegrade cardioplegia was given.  The patient was rewarmed to 37 degrees centigrade.  With the crossclamp in place, the 2 proximal vein graft anastomoses were performed, mid ascending aorta in an end-to-side manner using continuous 6-0 Prolene suture.  Then, the clamp was removed from the mammary pedicle.  There was rapid warming of the ventricular septum.  Cross- clamp was removed with time of 86 minutes and the patient spontaneously developed ventricular fibrillation and was defibrillated into sinus rhythm.  The proximal and distal anastomoses appeared hemostatic and allowed the grafts satisfactory.  Graft marker was placed around the proximal anastomoses.  Two temporary right ventricular and right atrial pacing wires placed and brought out through the skin.  When the patient rewarmed to 37 degrees centigrade, he was weaned from cardiopulmonary bypass on no inotropic agents.  Total bypass time was 127 minutes.  Cardiac function appeared excellent with cardiac output of 4.5 liters/minute.  Protamine was given and the venous and aortic cannula without difficulty.  Hemostasis was achieved.  Three chest tubes were placed with two in the posterior pericardium, one in the left pleural space, and one in the anterior mediastinum.  The sternum was then closed with double #6 stainless steel wires.  The fascia was closed with continuous #1 Vicryl suture.  Subcutaneous tissue was closed with continuous 2-0 Vicryl and skin with a 3-0 Vicryl subcuticular closure. The lower extremity vein harvest site was closed in layers in similar manner.  The sponge, needle, and instrument counts were correct according to the scrub nurse.  Dry sterile dressing was applied over the incisions around the chest tubes, which were hooked to Pleur-Evac suction.  The patient remained hemodynamically stable, was transferred to the SICU in guarded, but stable condition.     Evelene Croon, M.D.     BB/MEDQ  D:  11/25/2012  T:  11/26/2012  Job:  [id number]  cc:   Lyn Records, M.D.

## 2012-11-27 ENCOUNTER — Other Ambulatory Visit: Payer: Self-pay | Admitting: *Deleted

## 2012-11-27 ENCOUNTER — Inpatient Hospital Stay (HOSPITAL_COMMUNITY): Payer: Medicare Other

## 2012-11-27 ENCOUNTER — Encounter (HOSPITAL_COMMUNITY): Payer: Self-pay | Admitting: Surgery

## 2012-11-27 DIAGNOSIS — I2510 Atherosclerotic heart disease of native coronary artery without angina pectoris: Secondary | ICD-10-CM

## 2012-11-27 DIAGNOSIS — Z951 Presence of aortocoronary bypass graft: Secondary | ICD-10-CM

## 2012-11-27 DIAGNOSIS — E119 Type 2 diabetes mellitus without complications: Secondary | ICD-10-CM

## 2012-11-27 LAB — GLUCOSE, CAPILLARY
Glucose-Capillary: 97 mg/dL (ref 70–99)
Glucose-Capillary: 97 mg/dL (ref 70–99)
Glucose-Capillary: 124 mg/dL — ABNORMAL HIGH (ref 70–99)
Glucose-Capillary: 143 mg/dL — ABNORMAL HIGH (ref 70–99)
Glucose-Capillary: 106 mg/dL — ABNORMAL HIGH (ref 70–99)

## 2012-11-27 LAB — CBC
MCH: 31.8 pg (ref 26.0–34.0)
MCHC: 34 g/dL (ref 30.0–36.0)
Platelets: 108 10*3/uL — ABNORMAL LOW (ref 150–400)
RBC: 3.59 MIL/uL — ABNORMAL LOW (ref 4.22–5.81)
RDW: 13 % (ref 11.5–15.5)

## 2012-11-27 LAB — BASIC METABOLIC PANEL
BUN: 12 mg/dL (ref 6–23)
Calcium: 8.6 mg/dL (ref 8.4–10.5)
GFR calc non Af Amer: 81 mL/min — ABNORMAL LOW (ref >90)
Glucose, Bld: 142 mg/dL — ABNORMAL HIGH (ref 70–99)
Sodium: 132 mEq/L — ABNORMAL LOW (ref 135–145)

## 2012-11-27 MED ORDER — GLUCOSE BLOOD VI STRP: ORAL_STRIP | Status: DC

## 2012-11-27 MED ORDER — METFORMIN HCL 500 MG PO TABS: 500.0000 mg | ORAL_TABLET | Freq: Two times a day (BID) | ORAL | Status: DC

## 2012-11-27 MED ORDER — SODIUM CHLORIDE 0.9 % IV SOLN: 250.0000 mL | INTRAVENOUS | Status: DC | PRN

## 2012-11-27 MED ORDER — METOPROLOL TARTRATE 25 MG/10 ML ORAL SUSPENSION
12.5000 mg | Freq: Two times a day (BID) | ORAL | Status: DC
  Filled 2012-11-27 (×2): qty 5

## 2012-11-27 MED ORDER — POTASSIUM CHLORIDE CRYS ER 20 MEQ PO TBCR: 20.0000 meq | EXTENDED_RELEASE_TABLET | Freq: Two times a day (BID) | ORAL | Status: DC

## 2012-11-27 MED ORDER — LIVING WELL WITH DIABETES BOOK
Freq: Once | Status: AC
  Filled 2012-11-27: qty 1

## 2012-11-27 MED ORDER — FREESTYLE SYSTEM KIT: 1.0000 | PACK | Status: DC | PRN

## 2012-11-27 MED ORDER — MOVING RIGHT ALONG BOOK
Freq: Once | Status: AC
  Administered 2012-11-27: 17:00:00
  Filled 2012-11-27: qty 1

## 2012-11-27 MED ORDER — OXYCODONE HCL 5 MG PO TABS: 5.0000 mg | ORAL_TABLET | ORAL | Status: DC | PRN

## 2012-11-27 MED ORDER — METFORMIN HCL 500 MG PO TABS
500.0000 mg | ORAL_TABLET | Freq: Two times a day (BID) | ORAL | Status: DC
  Administered 2012-11-28 – 2012-11-29 (×3): 500 mg via ORAL
  Filled 2012-11-27 (×5): qty 1

## 2012-11-27 MED ORDER — INSULIN ASPART 100 UNIT/ML ~~LOC~~ SOLN
0.0000 [IU] | SUBCUTANEOUS | Status: DC
  Administered 2012-11-27 – 2012-11-28 (×3): 2 [IU] via SUBCUTANEOUS

## 2012-11-27 MED ORDER — METOPROLOL TARTRATE 50 MG PO TABS
50.0000 mg | ORAL_TABLET | Freq: Two times a day (BID) | ORAL | Status: DC
  Administered 2012-11-27 – 2012-11-29 (×4): 50 mg via ORAL
  Filled 2012-11-27 (×5): qty 1

## 2012-11-27 MED ORDER — SODIUM CHLORIDE 0.9 % IJ SOLN: 3.0000 mL | INTRAMUSCULAR | Status: DC | PRN

## 2012-11-27 MED ORDER — SODIUM CHLORIDE 0.9 % IJ SOLN: 3.0000 mL | Freq: Two times a day (BID) | INTRAMUSCULAR | Status: DC

## 2012-11-27 MED ORDER — METOPROLOL TARTRATE 25 MG PO TABS
25.0000 mg | ORAL_TABLET | Freq: Two times a day (BID) | ORAL | Status: DC
  Administered 2012-11-27: 25 mg via ORAL
  Filled 2012-11-27 (×2): qty 1

## 2012-11-27 MED ORDER — FUROSEMIDE 20 MG PO TABS: 20.0000 mg | ORAL_TABLET | Freq: Two times a day (BID) | ORAL | Status: DC

## 2012-11-27 MED ORDER — FREESTYLE LANCETS MISC: Status: DC

## 2012-11-27 NOTE — Progress Notes (Signed)
Pt has been voiding 50cc approx q1 hr.  Bladder scan showed 933.  I&O cath per protocol.  950cc obtained.  Pt. Feels relief.  Didn't realize earlier that he wasn't emptying because I had asked him.  Now realizes what it feels like and states he will call the nurse.  Stated he slept with urinal between his legs last night.  Explained condom cath to pt and offered to place one to assist pt in getting better rest tonight.  Pt wants condom cath placed as he has been dribbling in between and voiding only 50 cc's/hr.  Will report to HS nurse to re-eval /re-scan bladder.

## 2012-11-27 NOTE — Discharge Summary (Signed)
Physician Discharge Summary  Patient ID: Cody Hobbs MRN: [id number] DOB/AGE: 08/02/1935 78 y.o.  Admit date: 11/25/2012 Discharge date: 11/29/2012  Admission Diagnoses:  Patient Active Problem List  Diagnosis  . Coronary atherosclerosis of native coronary artery  . Other and unspecified angina pectoris  . CAD (coronary artery disease)  . Hypertension  . Hyperlipidemia   Discharge Diagnoses:   Patient Active Problem List  Diagnosis  . Coronary atherosclerosis of native coronary artery  . Other and unspecified angina pectoris  . CAD (coronary artery disease)  . Hypertension  . Hyperlipidemia  . S/P CABG x 3  . Diabetes   Discharged Condition: good  History of Present Illness:   Cody Hobbs is a 78 yo white male with known CAD S/P CABG x 4 performed in 1995 by Dr. Laneta Simmers.  The patient initially did very well.  However, in 2008/2009 the patient required PCI to the RCA vein graft.  The patient recently presented for routine follow up with Dr. Katrinka Blazing at which time the patient admitted to having a one time episode of substernal chest discomfort that occurred while the patient was deer hunting.  The pain resolved on its own and the patient denies any further episodes.  It was felt due to the patients history he should underwent an exercise treadmill test which was positive with a moderate to severe perfusion defect due to scar of infarct in the basal anterolateral and mid inferior lateral regions, mid anterolateral and apical regions with a preserved EF.  Therefore patient underwent cardiac catheterization on 10/30/2012 which revealed a patent IMA graft to the LAD, but severe native vessel disease with total occlusion of the RCA, LAD, and ostial LM disease of 80%.  Due to these findings it was felt the patient would benefit from redo coronary revascularization.  The patient was subsequently referred to Dr. Laneta Simmers on 11/09/2012 at which time it was felt the patient would benefit from a Redo  Coronary Bypass Grafting procedure.  The risks and benefits of the procedure were explained to the patient and his wife and he was agreeable to proceed.  Surgery was scheduled for 11/25/2012.   Hospital Course:   Cody Hobbs presented to [facility] on 11/25/2012.  He was taken to the operating room and underwent Redo CABG x 3 utilizing LIMA to LAD, SVG to OM2, and a sequential SVG to PDA and PLVB of RCA.  He also underwent endoscopic saphenous vein harvest of the left leg.  The patient tolerated the procedure well and was taken to the SICU in stable condition.  The patient was extubated the evening of surgery.  While in the ICU the patient was weaned off cardiac and insulin drips.  His chest tubes and arterial lines were removed.  He was medically stable and transferred to the step down unit of POD #2.  Since transfer the patient has done well.  He has been started on Metformin for diabetes management.  This is a new diagnosis for the patient with a preoperative A1c of 6.7.  He received diabetes education and we will defer further management to his PCP.  He is maintaining NSR and his pacing wires were removed without difficulty.  Should he continue to progress we anticipate discharge home in the next 24-48 hours.  He will follow up with Dr. Laneta Simmers on 12/22/2012.  He will also need to arrange follow up with his PCP and Dr. Katrinka Blazing.  Consults: Diabetes education  Significant Diagnostic Studies: angiography:   HEMODYNAMICS:  Aortic pressure was 152/71 mmHg; LV pressure was 157/3 mmHg; LVEDP 13 mm mercury. There was no gradient between the left ventricle and aorta.  ANGIOGRAPHIC DATA: The left main coronary artery is 50% ostial and 80% distal. Heavy calcification is noted..  The left anterior descending artery is totally occluded at the left main distal ostium.  The left circumflex artery is heavily calcified. 3 obtuse marginal branches are noted. The second obtuse marginal is a dominant branch. It is a  site of prior grafting. This vessel is diffusely diseased and 10 to anteriorly by the previous graft the first and third obtuse marginal branches are small and probably not graftable. The distal circumflex flex supplies collaterals to the distal right coronary  The right coronary artery is heavily calcified and totally occluded in the proximal vessel. The distal right coronary supplied by left coronary collaterals via the circumflex . Collaterals are not identified connecting the left anterior descending to the right coronary territory  LEFT VENTRICULOGRAM: Left ventricular angiogram was done in the 30 RAO projection and revealed normal left ventricular wall motion and systolic function with an estimated ejection fraction of 50 %.  BYPASS GRAFT ANGIOGRAPHY:  SVG to obtuse marginal #1: Totally occluded  SVG sequential to PDA and PLA: Totally occluded  Treatments: surgery:   OPERATIVE PROCEDURE: Redo median sternotomy, extracorporeal  circulation, coronary artery bypass graft surgery x3 using a saphenous  vein graft to the second obtuse marginal branch of the left circumflex  coronary artery, and a sequential saphenous vein graft to the posterior  descending and posterolateral branches of the right coronary artery.  Endoscopic vein harvesting from the left leg.  Disposition: 01-Home or Self Care  The patient has been discharged on:   1.Beta Blocker:  Yes [ x  ]                              No   [   ]                              If No, reason:  2.Ace Inhibitor/ARB: Yes [ x  ]                                     No  [  ]                                     If No, reason:  3.Statin:   Yes [x   ]                  No  [   ]                  If No, reason:  4.Ecasa:  Yes  [ x  ]                  No   [   ]                  If No, reason:     Discharge Orders    Future Appointments: Provider: Department: Dept Phone: Center:   12/22/2012 3:00 PM Alleen Borne, MD [facility] [phone]  [facility]     Future Orders Please Complete By Expires   Ambulatory referral to Nutrition and Diabetic Education   e   Comments:   Patient lives in [place].  New Onset DM  A1C: 6.7%       Medication List     As of 11/29/2012 10:31 AM    STOP taking these medications         atenolol 50 MG tablet   Commonly known as: TENORMIN      TAKE these medications         aspirin 325 MG EC tablet   Take 1 tablet (325 mg total) by mouth daily.      freestyle lancets   Use as instructed      furosemide 40 MG tablet   Commonly known as: LASIX   Take 1 tablet (40 mg total) by mouth daily. For 5 Days then stop      glucose blood test strip   Please check blood sugars three times daily before meals and at bedtime.  Please keep a record of results      glucose monitoring kit monitoring kit   1 each by Does not apply route as needed for other.      lisinopril 10 MG tablet   Commonly known as: PRINIVIL,ZESTRIL   Take 1 tablet (10 mg total) by mouth daily.      metFORMIN 500 MG tablet   Commonly known as: GLUCOPHAGE   Take 1 tablet (500 mg total) by mouth 2 (two) times daily with a meal.      metoprolol 50 MG tablet   Commonly known as: LOPRESSOR   Take 1 tablet (50 mg total) by mouth 2 (two) times daily.      potassium chloride SA 20 MEQ tablet   Commonly known as: K-DUR,KLOR-CON   Take 1 tablet (20 mEq total) by mouth daily. For 5 Days then stop      simvastatin 40 MG tablet   Commonly known as: ZOCOR   Take 40 mg by mouth every evening.      traMADol 50 MG tablet   Commonly known as: ULTRAM   Take 1 tablet (50 mg total) by mouth every 6 (six) hours as needed for pain.             Follow-up Information    Follow up with Alleen Borne, MD.   Contact information:   [address] [place] [address] [phone]       Follow up with [facility].   Contact information:   [place]       Follow up with  Lesleigh Noe, MD.   Contact information:   [address] [place] [address] [phone]          Signed: Lowella Dandy 11/27/2012, 1:29 PM

## 2012-11-27 NOTE — Progress Notes (Signed)
The patient is up and ambulating. There is minimal musculoskeletal/postsurgical pain.  Cardiac exam reveals no rub is heard.   The monitor demonstrates no atrial arrhythmias.  CONCLUSION: 1. Status post redo CABG.  2. Medically stable at this time with history of diabetes, hypertension, and hyperlipidemia.  RECOMMENDATIONS: We'll continue to follow

## 2012-11-27 NOTE — Addendum Note (Signed)
Addendum  created 11/27/12 1213 by Xavius Spadafore M Josuel Koeppen, CRNA   Modules edited:Anesthesia Medication Administration    

## 2012-11-27 NOTE — Progress Notes (Signed)
Patient with newly diagnosed Diabetes.  Patient education initiated.  Please add Diabetes to problem list for patient.    Alfredia Client PhD, RN, BC-ADM Diabetes Coordinator  Office:  [phone] Team Pager:  [phone]

## 2012-11-27 NOTE — Progress Notes (Signed)
CARDIAC REHAB PHASE I   PRE:  Rate/Rhythm: 94 SR PVCs  BP:  Supine:   Sitting: 150/80  Standing:    SaO2: 94%RA  MODE:  Ambulation: 200 ft   POST:  Rate/Rhythem: 101  BP:  Supine:   Sitting: 160/68  Standing:    SaO2: 95-96%RA  1429-1457 Pt states he has walked several times today. Willing to walk now. Pt states the lasix is really bothering him and he went to bathroom before and after our walk. Had a little difficulty getting to standing position but once up, he was steady. Walked 200 ft on RA with rolling walker and asst x 1. To recliner after walk.  Cody Hobbs

## 2012-11-27 NOTE — Progress Notes (Signed)
Refused to walk 2nd walk this evening.  Was worn out from voiding all day.

## 2012-11-27 NOTE — Plan of Care (Signed)
Problem: Phase III Progression Outcomes Goal: Time patient transferred to PCTU/Telemetry POD Outcome: Completed/Met Date Met:  11/27/12 Transferred to 2013 via wheelchair with propak.  Ambulated from door into room without difficulty.  Placed on monitor on 2000.  Vitals stable. Goal: Discharge plan remains appropriate-arrangements made Outcome: Completed/Met Date Met:  11/27/12 Home with wife upon discharge

## 2012-11-27 NOTE — Addendum Note (Signed)
Addendum  created 11/27/12 1213 by Edmonia Caprio, CRNA   Modules edited:Anesthesia Medication Administration

## 2012-11-27 NOTE — Progress Notes (Addendum)
2 Days Post-Op Procedure(s) (LRB): REDO CORONARY ARTERY BYPASS GRAFTING (CABG) (N/A) Subjective:  Cody Hobbs has no complaints this morning. He looks great and is ambulating without difficulty.  Objective: Vital signs in last 24 hours: Temp:  [97.5 F (36.4 C)-98.2 F (36.8 C)] 97.6 F (36.4 C) (01/24 0431) Pulse Rate:  [71-92] 92  (01/24 0700) Cardiac Rhythm:  [-] Normal sinus rhythm (01/24 0400) Resp:  [14-25] 22  (01/24 0700) BP: (110-151)/(55-80) 120/61 mmHg (01/24 0700) SpO2:  [91 %-97 %] 93 % (01/24 0700) Weight:  [235 lb 14.3 oz (107 kg)] 235 lb 14.3 oz (107 kg) (01/24 0500)  Intake/Output from previous day: 01/23 0701 - 01/24 0700 In: 667.1 [P.O.:170; I.V.:497.1] Out: 1715 [Urine:1665; Chest Tube:50]  General appearance: alert, cooperative and no distress Heart: regular rate and rhythm Lungs: clear to auscultation bilaterally Abdomen: soft, non-tender; bowel sounds normal; no masses,  no organomegaly Extremities: +1 edema Wound: clean and dry  Lab Results:  Basename 11/27/12 0500 11/26/12 1636  WBC 11.7* 12.9*  HGB 11.4* 11.5*  HCT 33.5* 33.1*  PLT 108* 115*   BMET:  Basename 11/27/12 0500 11/26/12 1636 11/26/12 1633 11/26/12 0400  NA 132* -- 138 --  K 3.9 -- 3.9 --  CL 97 -- 98 --  CO2 29 -- -- 23  GLUCOSE 142* -- 151* --  BUN 12 -- 13 --  CREATININE 0.90 0.93 -- --  CALCIUM 8.6 -- -- 8.2*    PT/INR:  Basename 11/25/12 1440  LABPROT 18.0*  INR 1.54*   ABG    Component Value Date/Time   PHART 7.308* 11/25/2012 2107   HCO3 22.3 11/25/2012 2107   TCO2 25 11/26/2012 1633   ACIDBASEDEF 4.0* 11/25/2012 2107   O2SAT 93.0 11/25/2012 2107   CBG (last 3)   Basename 11/27/12 0430 11/26/12 1951 11/26/12 1607  GLUCAP 138* 156* 127*    Assessment/Plan: S/P Procedure(s) (LRB): REDO CORONARY ARTERY BYPASS GRAFTING (CABG) (N/A)  1. CV- NSR good pressure control 120s SBP, tachy- will increase Lopressor to 25mg  BID 2. Pulm- no acute issues, wean oxygen as  tolerated, + atelectasis continue IS 3. Renal- creatinine, volume overload continue Lasix 4. DM- Borderline, Hgb A1c 6.7, will benefit from start of Metformin, consult diabetes education 5. Dispo- patient doing well, transfer to 2000   LOS: 2 days    BARRETT, ERIN 11/27/2012  I have seen and examined the patient and agree with the assessment and plan as outlined.  Simmie Garin H 11/27/2012 5:17 PM

## 2012-11-27 NOTE — Plan of Care (Signed)
Problem: Food- and Nutrition-Related Knowledge Deficit (NB-1.1) Goal: Nutrition education Formal process to instruct or train a patient/client in a skill or to impart knowledge to help patients/clients voluntarily manage or modify food choices and eating behavior to maintain or improve health.  Outcome: Completed/Met Date Met:  11/27/12  RD consulted for nutrition education regarding diabetes.     Lab Results  Component Value Date    HGBA1C 6.7* 11/23/2012    Upon RD visitation patient stated "I'm not a diabetic".  RD provided "Carbohydrate Counting for People with Diabetes" handout from the Academy of [employer] and Dietetics. Discussed importance of controlled and consistent carbohydrate intake throughout the day, diet/sugar-free beverage intake and limitation of sweets.  Expect fair compliance.  Body mass index is 30.29 kg/(m^2). Pt meets criteria for Obesity Class I based on current BMI.  Current diet order is Carbohydrate Modified Medium Calorie.  No % meal intake recorded at this time, however patient reports consuming 100% of breakfast.    Labs and medications reviewed.  No further nutrition interventions warranted at this time. Please re-consult RD as needed.  Maureen Chatters, RD, LDN Pager #: [phone] After-Hours Pager #: [phone]

## 2012-11-28 ENCOUNTER — Inpatient Hospital Stay (HOSPITAL_COMMUNITY): Payer: Medicare Other

## 2012-11-28 LAB — CBC
MCH: 31.5 pg (ref 26.0–34.0)
MCHC: 33.8 g/dL (ref 30.0–36.0)
MCV: 93.2 fL (ref 78.0–100.0)
Platelets: 133 10*3/uL — ABNORMAL LOW (ref 150–400)
RBC: 3.81 MIL/uL — ABNORMAL LOW (ref 4.22–5.81)
RDW: 12.9 % (ref 11.5–15.5)

## 2012-11-28 LAB — TYPE AND SCREEN
Antibody Screen: NEGATIVE
Unit division: 0

## 2012-11-28 LAB — GLUCOSE, CAPILLARY
Glucose-Capillary: 109 mg/dL — ABNORMAL HIGH (ref 70–99)
Glucose-Capillary: 127 mg/dL — ABNORMAL HIGH (ref 70–99)

## 2012-11-28 LAB — BASIC METABOLIC PANEL
Calcium: 9.4 mg/dL (ref 8.4–10.5)
Creatinine, Ser: 0.98 mg/dL (ref 0.50–1.35)
GFR calc Af Amer: >90 mL/min (ref >90)
GFR calc non Af Amer: 78 mL/min — ABNORMAL LOW (ref >90)
Sodium: 139 mEq/L (ref 135–145)

## 2012-11-28 MED ORDER — LISINOPRIL 5 MG PO TABS
5.0000 mg | ORAL_TABLET | Freq: Every day | ORAL | Status: DC
  Administered 2012-11-28: 5 mg via ORAL
  Filled 2012-11-28 (×2): qty 1

## 2012-11-28 MED ORDER — INSULIN ASPART 100 UNIT/ML ~~LOC~~ SOLN
0.0000 [IU] | Freq: Three times a day (TID) | SUBCUTANEOUS | Status: DC
  Administered 2012-11-28: 2 [IU] via SUBCUTANEOUS

## 2012-11-28 MED ORDER — FUROSEMIDE 40 MG PO TABS
40.0000 mg | ORAL_TABLET | Freq: Every day | ORAL | Status: DC
  Filled 2012-11-28: qty 1

## 2012-11-28 NOTE — Progress Notes (Addendum)
                   [address]            [name] [address]          [phone]      3 Days Post-Op Procedure(s) (LRB): REDO CORONARY ARTERY BYPASS GRAFTING (CABG) (N/A)  Subjective: Patient with productive (mostly clear) cough. Has had a small bowel movement. No other complaints  Objective: Vital signs in last 24 hours: Temp:  [97.3 F (36.3 C)-97.9 F (36.6 C)] 97.9 F (36.6 C) (01/25 0416) Pulse Rate:  [87-101] 87  (01/25 0416) Cardiac Rhythm:  [-] Normal sinus rhythm (01/25 0736) Resp:  [18-23] 20  (01/25 0416) BP: (135-157)/(65-79) 150/79 mmHg (01/25 0416) SpO2:  [95 %-97 %] 95 % (01/25 0416) Weight:  [104.055 kg (229 lb 6.4 oz)] 104.055 kg (229 lb 6.4 oz) (01/25 0416)  Pre op weight 101 kg Current Weight  11/28/12 104.055 kg (229 lb 6.4 oz)       Intake/Output from previous day: 01/24 0701 - 01/25 0700 In: 560 [P.O.:500; I.V.:60] Out: 1375 [Urine:1375]   Physical Exam:  Cardiovascular: RRR, no murmurs, gallops, or rubs. Pulmonary: Clear to auscultation bilaterally; no rales, wheezes, or rhonchi. Abdomen: Soft, non tender, bowel sounds present. Extremities: Mild bilateral lower extremity edema. Wounds: Clean and dry.  No erythema or signs of infection.  Lab Results: CBC: Basename 11/28/12 0645 11/27/12 0500  WBC 11.2* 11.7*  HGB 12.0* 11.4*  HCT 35.5* 33.5*  PLT 133* 108*   BMET:  Basename 11/27/12 0500 11/26/12 1636 11/26/12 1633 11/26/12 0400  NA 132* -- 138 --  K 3.9 -- 3.9 --  CL 97 -- 98 --  CO2 29 -- -- 23  GLUCOSE 142* -- 151* --  BUN 12 -- 13 --  CREATININE 0.90 0.93 -- --  CALCIUM 8.6 -- -- 8.2*    PT/INR:  Lab Results  Component Value Date   INR 1.54* 11/25/2012   INR 0.96 11/23/2012   ABG:  INR: Will add last result for INR, ABG once components are confirmed Will add last 4 CBG results once components are confirmed  Assessment/Plan:  1. CV - SR. On Lopressor 50 bid. Will start low dose Lisinopril for  better SBP control 2.  Pulmonary - CXR this am shows trace right apical pneumothorax, small bilateral pleural effusions, and mild bibasilar atelectasis. Encourage incentive spirometer 3. Volume Overload - On lasix 20 bid. Will change to 40 daily 4.  Acute blood loss anemia - H and H stable at 12 and 35.5 5.Thrombocytopenia-platelets up to 133,000 6.DM- CBGs 143/106/135.Pre op HGA1C 6.7.On Metformin 500 bid with good glucose control. Will need follow up as an outpatient 7.Remove EPW in am 8.Possible discharge Monday  ZIMMERMAN,DONIELLE MPA-C 11/28/2012,8:13 AM   Patient examined and Incisions clean and dry Making good progress after redo CABG Probable discharge home Monday  patient examined and medical record reviewed,agree with above note. VAN TRIGT III,Kyvon Hu 11/28/2012

## 2012-11-28 NOTE — Progress Notes (Signed)
Courtesy visit  Doing well No complaints No rub  Redo CABG Hopeful DC soon.

## 2012-11-28 NOTE — Progress Notes (Signed)
CARDIAC REHAB PHASE I   PRE:  Rate/Rhythm: 90 SR    BP: sitting 134/80    SaO2: 93  RA  MODE:  Ambulation: 350 ft   POST:  Rate/Rhythm: 108     BP: sitting to BR     SaO2: 95 RA  Pt doing well walking with RW however condom catheter came off during walk. Had to return to room. Will f/u. Sts he has a RW at home.  [id number]  Harriet Masson CES, ACSM

## 2012-11-29 LAB — GLUCOSE, CAPILLARY

## 2012-11-29 MED ORDER — POTASSIUM CHLORIDE CRYS ER 20 MEQ PO TBCR: 20.0000 meq | EXTENDED_RELEASE_TABLET | Freq: Every day | ORAL | Status: DC

## 2012-11-29 MED ORDER — LISINOPRIL 10 MG PO TABS
10.0000 mg | ORAL_TABLET | Freq: Every day | ORAL | Status: DC
  Administered 2012-11-29: 10 mg via ORAL
  Filled 2012-11-29: qty 1

## 2012-11-29 MED ORDER — ASPIRIN 325 MG PO TBEC: 325.0000 mg | DELAYED_RELEASE_TABLET | Freq: Every day | ORAL | Status: DC

## 2012-11-29 MED ORDER — FUROSEMIDE 40 MG PO TABS: 40.0000 mg | ORAL_TABLET | Freq: Every day | ORAL | Status: DC

## 2012-11-29 MED ORDER — LISINOPRIL 10 MG PO TABS: 10.0000 mg | ORAL_TABLET | Freq: Every day | ORAL | Status: DC

## 2012-11-29 MED ORDER — TRAMADOL HCL 50 MG PO TABS: 50.0000 mg | ORAL_TABLET | Freq: Four times a day (QID) | ORAL | Status: DC | PRN

## 2012-11-29 MED ORDER — METOPROLOL TARTRATE 50 MG PO TABS: 50.0000 mg | ORAL_TABLET | Freq: Two times a day (BID) | ORAL | Status: DC

## 2012-11-29 MED ORDER — METFORMIN HCL 500 MG PO TABS: 500.0000 mg | ORAL_TABLET | Freq: Two times a day (BID) | ORAL | Status: DC

## 2012-11-29 NOTE — Progress Notes (Signed)
                   [address]            [place] [address]          [phone]      4 Days Post-Op Procedure(s) (LRB): REDO CORONARY ARTERY BYPASS GRAFTING (CABG) (N/A)  Subjective: Patient really wants to go home today. He has no complaints.  Objective: Vital signs in last 24 hours: Temp:  [98 F (36.7 C)-99 F (37.2 C)] 99 F (37.2 C) (01/26 0450) Pulse Rate:  [79-91] 81  (01/26 0450) Cardiac Rhythm:  [-] Normal sinus rhythm (01/25 1946) Resp:  [18-20] 18  (01/26 0450) BP: (130-134)/(58-79) 134/63 mmHg (01/26 0450) SpO2:  [94 %-95 %] 95 % (01/26 0450) Weight:  [103.103 kg (227 lb 4.8 oz)] 103.103 kg (227 lb 4.8 oz) (01/26 0450)  Pre op weight 101 kg Current Weight  11/29/12 103.103 kg (227 lb 4.8 oz)       Intake/Output from previous day: 01/25 0701 - 01/26 0700 In: -  Out: 800 [Urine:800]   Physical Exam:  Cardiovascular: RRR, no murmurs, gallops, or rubs. Pulmonary: Clear to auscultation bilaterally; no rales, wheezes, or rhonchi. Abdomen: Soft, non tender, bowel sounds present. Extremities: Mild bilateral lower extremity edema. Wounds: Clean and dry.  No erythema or signs of infection.  Lab Results: CBC:  Basename 11/28/12 0645 11/27/12 0500  WBC 11.2* 11.7*  HGB 12.0* 11.4*  HCT 35.5* 33.5*  PLT 133* 108*   BMET:   Basename 11/28/12 0645 11/27/12 0500  NA 139 132*  K 4.1 3.9  CL 99 97  CO2 27 29  GLUCOSE 136* 142*  BUN 14 12  CREATININE 0.98 0.90  CALCIUM 9.4 8.6    PT/INR:  Lab Results  Component Value Date   INR 1.54* 11/25/2012   INR 0.96 11/23/2012   ABG:  INR: Will add last result for INR, ABG once components are confirmed Will add last 4 CBG results once components are confirmed  Assessment/Plan:  1. CV - PVCs/SR. On Lopressor 50 bid and Lisinopril 5 daily. Will increase Lisinopril to 10 daily for better SBP control. 2.  Pulmonary - CXR this am shows trace right apical pneumothorax, small bilateral  pleural effusions, and mild bibasilar atelectasis. Encourage incentive spirometer 3. Volume Overload - On lasix 40 daily 4.  Acute blood loss anemia - H and H stable at 12 and 35.5 5.Thrombocytopenia-platelets up to 133,000 6.DM- CBGs 109/127/117.Pre op HGA1C 6.7.On Metformin 500 bid with good glucose control. Will need follow up as an outpatient 7.Remove EPW  8.Possible discharge later today vs am  Cassara Nida MPA-C 11/29/2012,7:57 AM

## 2012-11-29 NOTE — Progress Notes (Signed)
Removed CT sutures and applied benzoin and 1/4" steris.  Removed EPWires  (4  Wires intact) without any signs of distress or ectopy.  Frequent vitals documented.  Pt on bed time for one hour.  Call bell within reach.  Will continue to monitor. Thomas Hoff

## 2012-11-29 NOTE — Progress Notes (Signed)
Courtesy visit  Hopeful DC today Looks good. Agree with increase lisinopril to 10.  Follow up with Dr. Katrinka Blazing.

## 2012-12-22 ENCOUNTER — Ambulatory Visit
Admission: RE | Admit: 2012-12-22 | Discharge: 2012-12-22 | Disposition: A | Discharge status: Home / Self Care | Payer: Medicare Other | Source: Ambulatory Visit | Attending: Surgery | Admitting: Surgery

## 2012-12-22 ENCOUNTER — Encounter: Payer: Self-pay | Admitting: Surgery

## 2012-12-22 ENCOUNTER — Ambulatory Visit (INDEPENDENT_AMBULATORY_CARE_PROVIDER_SITE_OTHER): Payer: Medicare Other | Admitting: Physician Assistant

## 2012-12-22 VITALS — BP 119/65 | HR 100 | Resp 20 | Ht 74.0 in | Wt 227.0 lb

## 2012-12-22 DIAGNOSIS — Z951 Presence of aortocoronary bypass graft: Secondary | ICD-10-CM

## 2012-12-22 DIAGNOSIS — I2510 Atherosclerotic heart disease of native coronary artery without angina pectoris: Secondary | ICD-10-CM

## 2012-12-22 NOTE — Progress Notes (Signed)
HPI: Patient returns for routine postoperative follow-up having undergone Redo CABG x 3 on 11/25/2012. The patient's early postoperative recovery while in the hospital was unremarkable. Since hospital discharge the patient reports he has been having trouble with his bladder.  The patient's wife is very upset stating the patient had issues with urinary retention in the hospital.  She states that she feels this was ignored by the nursing staff.  She states that the patient's catheter had to be re-inserted during his hospital stay for inability to empty his bladder.  She states that once it was removed the patient was still unable to urinate.  She states since discharge he has been seen by Urology several times.  He required evaluation due to inability to urinate at home.  The patient states he would stand up and urine would just leak out.  He became febrile and was evaluated at urgent care and diagnosed with a UTI.  He currently has an indwelling catheter for the next month.  The patient has also been experiencing some dizziness upon awakening.  He states that he gets somewhat light headed and has to get his bearings prior to moving throughout the house.  He actually fell this morning upon awakening after thinking some one rang the doorbell and he was trying to get to the door.  Finally his sugars have been under good control.  They have been consistently below 150.    Current Outpatient Prescriptions  Medication Sig Dispense Refill  . aspirin EC 325 MG EC tablet Take 1 tablet (325 mg total) by mouth daily.  30 tablet    . glucose blood (FREESTYLE LITE) test strip Please check blood sugars three times daily before meals and at bedtime.  Please keep a record of results  100 each  12  . glucose monitoring kit (FREESTYLE) monitoring kit 1 each by Does not apply route as needed for other.  1 each  0  . Lancets (FREESTYLE) lancets Use as instructed  100 each  12  . lisinopril (PRINIVIL,ZESTRIL) 10 MG tablet  Take 1 tablet (10 mg total) by mouth daily.  30 tablet  1  . metFORMIN (GLUCOPHAGE) 500 MG tablet Take 1 tablet (500 mg total) by mouth 2 (two) times daily with a meal.  60 tablet  1  . metoprolol (LOPRESSOR) 50 MG tablet Take 1 tablet (50 mg total) by mouth 2 (two) times daily.  60 tablet  1  . simvastatin (ZOCOR) 40 MG tablet Take 40 mg by mouth every evening.       No current facility-administered medications for this visit.    Physical Exam:  BP 119/65  Pulse 100  Resp 20  Ht 6\' 2"  (1.88 m)  Wt 227 lb (102.967 kg)  BMI 29.13 kg/m2  SpO2 95%  Gen: no apparent distress Lungs: CTA bilaterally Heart: RRR Abd: soft non-tender, non-distended Skin: incisions well healed  Diagnostic Tests:  CXR: resolution of bilateral pleural effusions  Impression:  Mr. Kaley is S/P Redo CABG x 3.  From surgery standpoint he is doing well.  His dizziness may be associated with his blood pressure medications.  He is not currently taking Lasix.  In regards to his urinary retention, hopefully this will improve over time.  However he is being managed by Urology  Plan:  In regards to his dizziness I recommended decreasing his Lisinopril dose to 5 mg daily to see if there is any improvement with his dizziness.  We will have him return to our  office in one month to see Dr. Laneta Simmers.  He was instructed to contact our office should he have problems prior to that appointment.  He will follow up with Dr. Katrinka Blazing on Friday

## 2012-12-24 ENCOUNTER — Other Ambulatory Visit: Payer: Self-pay | Admitting: *Deleted

## 2013-01-18 ENCOUNTER — Encounter: Payer: Self-pay | Admitting: Surgery

## 2013-01-18 ENCOUNTER — Ambulatory Visit (INDEPENDENT_AMBULATORY_CARE_PROVIDER_SITE_OTHER): Payer: Medicare Other | Admitting: Surgery

## 2013-01-18 ENCOUNTER — Ambulatory Visit: Payer: Medicare Other | Admitting: Surgery

## 2013-01-18 VITALS — BP 179/90 | HR 94 | Resp 18 | Ht 74.0 in | Wt 205.0 lb

## 2013-01-18 DIAGNOSIS — Z951 Presence of aortocoronary bypass graft: Secondary | ICD-10-CM

## 2013-01-18 NOTE — Progress Notes (Signed)
[address]       [place] [address]             [phone]      HPI:  The patient returns today for followup status post redo coronary bypass graft surgery x3 on 11/25/2012. His postoperative course was complicated by urinary retention. Apparently he had an in and out cath and then continued to dribble urine throughout the remainder of his postop course. According to the patient and his wife he was still not voiding urine at the time of discharge and continued having continuous dribbling of urine at home. He apparently became very uncomfortable and was seen by a urologist and was found to have about 1500 cc of urine in his bladder. He was treated with a Foley catheter for a period of time and then had it removed but still could not void urine. The catheter was reinserted and he is now being followed by Dr. Bjorn Pippin. He still has a catheter in and recently underwent a urodynamic study, the results of which are pending. He also has been treated for two urinary tract infections. This is the first time that I have heard of any of this because I went on vacation the day after his surgery. It is still not clear to me how he could go through his hospitalization and get discharged without being able to void urine. He said that he is doing well from a cardiac standpoint. He is walking daily without chest pain or shortness of breath. He is planning on starting cardiac rehabilitation in [place] tomorrow.  Current Outpatient Prescriptions  Medication Sig Dispense Refill  . aspirin EC 325 MG EC tablet Take 1 tablet (325 mg total) by mouth daily.  30 tablet    . glucose blood (FREESTYLE LITE) test strip Please check blood sugars three times daily before meals and at bedtime.  Please keep a record of results  100 each  12  . glucose monitoring kit (FREESTYLE) monitoring kit 1 each by Does not apply route as needed for other.  1 each  0  . Lancets (FREESTYLE) lancets Use as instructed  100  each  12  . lisinopril (PRINIVIL,ZESTRIL) 10 MG tablet Take 10 mg by mouth daily. 1/2 of 10 mg (5 mg total per day)      . metFORMIN (GLUCOPHAGE) 500 MG tablet Take 1 tablet (500 mg total) by mouth 2 (two) times daily with a meal.  60 tablet  1  . metoprolol (LOPRESSOR) 50 MG tablet Take 1 tablet (50 mg total) by mouth 2 (two) times daily.  60 tablet  1  . simvastatin (ZOCOR) 40 MG tablet Take 40 mg by mouth every evening.       No current facility-administered medications for this visit.     Physical Exam: BP 179/90  Pulse 94  Resp 18  Ht 6\' 2"  (1.88 m)  Wt 205 lb (92.987 kg)  BMI 26.31 kg/m2  SpO2 98% He looks well. Cardiac exam shows a regular rate and rhythm with normal heart sounds. His lung exam is clear. The chest incision is healing well and sternum is stable. The leg incision is healing well and there is no peripheral edema.    Impression:  He is making a good recovery from a cardiac standpoint. Unfortunately he is still having problems with bladder dysfunction and has an indwelling Foley catheter. When he was seen by my physician assistant at his last visit he was noted to  be dizzy and actually fell and hit his head at home. His systolic blood pressure was 110 at that time in the office and his lisinopril was decreased from 10 mg a day to 5 mg per day. Today his blood pressure is elevated. He said he had his blood pressure checked yesterday and it was in the 160s. I think it would be best to increase his lisinopril back to 10 mg per day. He feels that his prior dizziness was related to the Flomax that he was started on, since his dizziness started right after that medication was given.  Plan:  He will followup with Dr. Annabell Howells concerning his bladder dysfunction. He has a followup appointment in a couple weeks with Dr. Katrinka Blazing and will have his blood pressure reevaluated at that time. He will increase his lisinopril to 10 mg per day. He will start cardiac rehabilitation tomorrow.  I told the patient and his wife that he did not need to return to see me unless he had a problem with his incisions.

## 2013-01-19 ENCOUNTER — Ambulatory Visit: Payer: No Typology Code available for payment source | Admitting: Surgery

## 2013-01-19 ENCOUNTER — Ambulatory Visit: Payer: Medicare Other | Admitting: Surgery

## 2013-01-20 ENCOUNTER — Ambulatory Visit: Payer: Medicare Other | Admitting: Surgery

## 2013-01-26 ENCOUNTER — Encounter (INDEPENDENT_AMBULATORY_CARE_PROVIDER_SITE_OTHER): Payer: Medicare Other | Admitting: Urology

## 2013-01-26 DIAGNOSIS — N401 Enlarged prostate with lower urinary tract symptoms: Secondary | ICD-10-CM

## 2013-01-26 DIAGNOSIS — N390 Urinary tract infection, site not specified: Secondary | ICD-10-CM

## 2013-01-29 ENCOUNTER — Other Ambulatory Visit: Payer: Self-pay | Admitting: Physician Assistant

## 2013-01-29 ENCOUNTER — Ambulatory Visit (INDEPENDENT_AMBULATORY_CARE_PROVIDER_SITE_OTHER): Payer: Medicare Other | Admitting: Urology

## 2013-01-29 DIAGNOSIS — R338 Other retention of urine: Secondary | ICD-10-CM

## 2013-01-29 DIAGNOSIS — N401 Enlarged prostate with lower urinary tract symptoms: Secondary | ICD-10-CM

## 2013-02-01 ENCOUNTER — Encounter (HOSPITAL_COMMUNITY): Payer: Medicare Other

## 2013-02-01 ENCOUNTER — Other Ambulatory Visit (HOSPITAL_COMMUNITY): Payer: No Typology Code available for payment source

## 2013-02-09 ENCOUNTER — Other Ambulatory Visit: Payer: Self-pay | Admitting: Urology

## 2013-02-10 ENCOUNTER — Encounter (HOSPITAL_COMMUNITY): Payer: Self-pay | Admitting: Pharmacy Technician

## 2013-02-12 ENCOUNTER — Encounter (HOSPITAL_COMMUNITY): Payer: Self-pay

## 2013-02-12 NOTE — Patient Instructions (Addendum)
[name]  02/12/2013   Your procedure is scheduled on:  02/16/13  TUESDAY  Report to [facility] at  0715     AM.  Call this number if you have problems the morning of surgery: [phone]       Remember:   Do not eat food  Or drink :After Midnight. Monday NIGHT   Take these medicines the morning of surgery with A SIP OF WATER:  METOPROLOL DO NOT TAKE ANY BLOOD SUGAR MEDICATION Tuesday MORNING  .  Contacts, dentures or partial plates can not be worn to surgery  Leave suitcase in the car. After surgery it may be brought to your room.  For patients admitted to the hospital, checkout time is 11:00 AM day of  discharge.             SPECIAL INSTRUCTIONS- SEE [place] PREPARING FOR SURGERY INSTRUCTION SHEET-     DO NOT WEAR JEWELRY, LOTIONS, POWDERS, OR PERFUMES.  WOMEN-- DO NOT SHAVE LEGS OR UNDERARMS FOR 12 HOURS BEFORE SHOWERS. MEN MAY SHAVE FACE.  Patients discharged the day of surgery will not be allowed to drive home. IF going home the day of surgery, you must have a driver and someone to stay with you for the first 24 hours  Name and phone number of your driver:  Wife  [place]                                                                      Please read over the following fact sheets that you were given: MRSA Information, Incentive Spirometry Sheet, Blood Transfusion Sheet  Information                                                                                   [place]  PST 336  [id number]                 FAILURE TO FOLLOW THESE INSTRUCTIONS MAY RESULT IN  CANCELLATION   OF YOUR SURGERY                                                  Patient Signature _____________________________

## 2013-02-12 NOTE — Progress Notes (Signed)
Chest x ray 11/27/12 EPIC,  OV, EKG and Clearance Dr Katrinka Blazing 02/04/13 on chart, copy stress test 12/13, cath 12/13 on chart

## 2013-02-15 ENCOUNTER — Encounter (HOSPITAL_COMMUNITY)
Admission: RE | Admit: 2013-02-15 | Discharge: 2013-02-15 | Disposition: A | Discharge status: Home / Self Care | Payer: Medicare Other | Source: Ambulatory Visit | Attending: Urology | Admitting: Urology

## 2013-02-15 ENCOUNTER — Encounter (HOSPITAL_COMMUNITY): Payer: Self-pay

## 2013-02-15 HISTORY — DX: Benign prostatic hyperplasia with lower urinary tract symptoms: N40.1

## 2013-02-15 HISTORY — DX: Cardiac arrhythmia, unspecified: I49.9

## 2013-02-15 HISTORY — DX: Type 2 diabetes mellitus without complications: E11.9

## 2013-02-15 HISTORY — DX: Other seasonal allergic rhinitis: J30.2

## 2013-02-15 HISTORY — DX: Other retention of urine: R33.8

## 2013-02-15 LAB — CBC
MCV: 91.1 fL (ref 78.0–100.0)
Platelets: 349 10*3/uL (ref 150–400)
RBC: 4.61 MIL/uL (ref 4.22–5.81)
WBC: 7 10*3/uL (ref 4.0–10.5)

## 2013-02-15 LAB — BASIC METABOLIC PANEL
CO2: 30 mEq/L (ref 19–32)
Chloride: 102 mEq/L (ref 96–112)
Potassium: 4.5 mEq/L (ref 3.5–5.1)
Sodium: 141 mEq/L (ref 135–145)

## 2013-02-15 LAB — SURGICAL PCR SCREEN
MRSA, PCR: NEGATIVE
Staphylococcus aureus: NEGATIVE

## 2013-02-15 MED ORDER — GENTAMICIN SULFATE 40 MG/ML IJ SOLN
5.0000 mg/kg | INTRAVENOUS | Status: DC
  Filled 2013-02-15: qty 11.63

## 2013-02-15 NOTE — H&P (Signed)
ctive Problems 1. Acute Urinary Retention 788.20 2. Benign Prostatic Hypertrophy With Urinary Obstruction 600.01 3. 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 Urinary Tract Infection 599.0  History of Present Illness  Mr Lemoine returns today following UDS for his post CABG retention.   He had a fever of 102 today and feels like he has a UTI.  He still has a foley and the urine has been dark.  He was on antibiotics for the study which showed a 451cc capacity bladder with low amplitude instability and loss of compliance.  His DLPP was 47cm at 451cc filling with only small volume loss.   He voided with a very weak flow of 0.5-1cc/sec with some straining and possible weak detrusor contraction on top of his loss of compliance.   Past Medical History 1. History of  Acute Myocardial Infarction V12.59 2. History of  Diabetes Mellitus 250.00 3. History of  Hypercholesterolemia 272.0 4. History of  Hypertension 401.9 5. History of  Nephrolithiasis V13.01  Surgical History 1. History of  CABG (CABG) V45.81 2. History of  CABG (CABG) V45.81  Current Meds 1. Aspirin 325 MG Oral Tablet; Therapy: (Recorded:12Feb2014) to 2. Bactrim DS 800-160 MG Oral Tablet; Therapy: (Recorded:12Feb2014) to 3. Ciprofloxacin HCl 500 MG Oral Tablet; TAKE 1 TABLET BY MOUTH   TWICE A DAY; Therapy:  12Feb2014 to (Evaluate:15Mar2014)  Requested for: 11Mar2014; Last Rx:11Mar2014 4. Lisinopril 10 MG Oral Tablet; Therapy: (Recorded:12Feb2014) to 5. MetFORMIN HCl 500 MG Oral Tablet; Therapy: (Recorded:12Feb2014) to 6. Metoprolol Tartrate 50 MG Oral Tablet; Therapy: (Recorded:12Feb2014) to 7. Simvastatin 40 MG Oral Tablet; Therapy: (Recorded:12Feb2014) to 8. Tamsulosin HCl 0.4 MG Oral Capsule; Therapy: (Recorded:12Feb2014) to 9. TraMADol HCl 50 MG Oral Tablet; Therapy: (Recorded:12Feb2014) to  Allergies 1. Penicillins  Family History 1. Family history of  Death In The Family Father 2. Family history of   Death In The Family Mother 3. Family history of  Family Health Status Number Of Children  Social History 1. Caffeine Use per day 2. Former Smoker V15.82 Smoked 1 ppd for 10 yrs and quit 1978 3. Marital History - Currently Married 4. Occupation: Retired Nurse, children's  5. History of  Alcohol Use  Review of Systems  Gastrointestinal: no diarrhea and no constipation.  Constitutional: fever (102) and chills.  Cardiovascular: no chest pain.  Respiratory: cough, but no shortness of breath.    Vitals Vital Signs [Data Includes: Last 1 Day]  28Mar2014 01:50PM  Blood Pressure: 117 / 70 Temperature: 99 F Heart Rate: 114  Physical Exam Constitutional: Well nourished and well developed . No acute distress.  Pulmonary: No respiratory distress and normal respiratory rhythm and effort.  Cardiovascular: Heart rate and rhythm are normal . No peripheral edema.    Results/Data  The following images/tracing/specimen were independently visualized:  I have reviewed the urodynamic tracing, flouro and report.    Procedure  Gentamycin 160mg  IM given today.     Assessment 1. 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 Acute Urinary Retention 788.20 4. Benign Prostatic Hypertrophy With Urinary Obstruction 600.01 5. Urinary Tract Infection 599.0   He has persistant retention with a hypotonic bladder with loss of compliance.   His DLPP was 47cm which suggests some obstruction.  He may have a UTI today.   Plan Acute Urinary Retention (788.20)  1. Follow-up Schedule Surgery Office  Follow-up  Done: 28Mar2014 Urinary Tract Infection (599.0)  2. Ciprofloxacin HCl 250 MG Oral Tablet; TAKE 1 TABLET EVERY 12 HOURS DAILY; Therapy:  28Mar2014 to (  Evaluate:07Apr2014)  Requested for: 28Mar2014; Last Rx:28Mar2014; Edited 3. Gentamicin Sulfate 40 MG/ML Injection Solution; INJECT 160 MG Intramuscular; Done:  28Mar2014 03:26PM; Status: COMPLETE   Urine culture was obtained prior to giving gent by  clamping his foley and collecting a specimen. He was given Cipro 250mg  bid #20 pending the culture. I discussed the treatment options and I think he will be best served by a TURP or TUEVP.   I reviewed the risks of bleeding, infection, persistant retention, stricturing, incontinence, thrombotic events and anesthetic complications.   I will put in a suprapubic tube as well just in case. He will be cleared by Dr. Katrinka Blazing prior to the procedure, but we will shoot for 02/11/13.   Discussion/Summary     CC: Dr. Wyvonnia Lora and Dr. Verdis Prime.

## 2013-02-16 ENCOUNTER — Ambulatory Visit (HOSPITAL_COMMUNITY): Payer: Medicare Other | Admitting: Anesthesiology

## 2013-02-16 ENCOUNTER — Encounter (HOSPITAL_COMMUNITY)
Admission: RE | Disposition: A | Discharge status: Home / Self Care | Payer: Self-pay | Source: Ambulatory Visit | Attending: Urology

## 2013-02-16 ENCOUNTER — Encounter (HOSPITAL_COMMUNITY): Payer: Self-pay | Admitting: *Deleted

## 2013-02-16 ENCOUNTER — Observation Stay (HOSPITAL_COMMUNITY)
Admission: RE | Admit: 2013-02-16 | Discharge: 2013-02-17 | Disposition: A | Discharge status: Home / Self Care | Payer: Medicare Other | Source: Ambulatory Visit | Attending: Urology | Admitting: Urology

## 2013-02-16 ENCOUNTER — Encounter (HOSPITAL_COMMUNITY): Payer: Self-pay | Admitting: Anesthesiology

## 2013-02-16 DIAGNOSIS — I10 Essential (primary) hypertension: Secondary | ICD-10-CM | POA: Insufficient documentation

## 2013-02-16 DIAGNOSIS — R339 Retention of urine, unspecified: Secondary | ICD-10-CM | POA: Insufficient documentation

## 2013-02-16 DIAGNOSIS — N401 Enlarged prostate with lower urinary tract symptoms: Secondary | ICD-10-CM

## 2013-02-16 DIAGNOSIS — I252 Old myocardial infarction: Secondary | ICD-10-CM | POA: Insufficient documentation

## 2013-02-16 DIAGNOSIS — E119 Type 2 diabetes mellitus without complications: Secondary | ICD-10-CM | POA: Insufficient documentation

## 2013-02-16 DIAGNOSIS — Z951 Presence of aortocoronary bypass graft: Secondary | ICD-10-CM | POA: Insufficient documentation

## 2013-02-16 DIAGNOSIS — N138 Other obstructive and reflux uropathy: Secondary | ICD-10-CM | POA: Insufficient documentation

## 2013-02-16 DIAGNOSIS — N312 Flaccid neuropathic bladder, not elsewhere classified: Principal | ICD-10-CM | POA: Insufficient documentation

## 2013-02-16 DIAGNOSIS — Z87442 Personal history of urinary calculi: Secondary | ICD-10-CM | POA: Insufficient documentation

## 2013-02-16 DIAGNOSIS — E7800 Pure hypercholesterolemia, unspecified: Secondary | ICD-10-CM | POA: Insufficient documentation

## 2013-02-16 DIAGNOSIS — N390 Urinary tract infection, site not specified: Secondary | ICD-10-CM | POA: Insufficient documentation

## 2013-02-16 DIAGNOSIS — R509 Fever, unspecified: Secondary | ICD-10-CM | POA: Insufficient documentation

## 2013-02-16 DIAGNOSIS — N320 Bladder-neck obstruction: Secondary | ICD-10-CM | POA: Insufficient documentation

## 2013-02-16 DIAGNOSIS — N318 Other neuromuscular dysfunction of bladder: Secondary | ICD-10-CM | POA: Insufficient documentation

## 2013-02-16 HISTORY — PX: TRANSURETHRAL RESECTION OF PROSTATE: SHX73

## 2013-02-16 HISTORY — PX: INSERTION OF SUPRAPUBIC CATHETER: SHX5870

## 2013-02-16 LAB — GLUCOSE, CAPILLARY
Glucose-Capillary: 110 mg/dL — ABNORMAL HIGH (ref 70–99)
Glucose-Capillary: 98 mg/dL (ref 70–99)
Glucose-Capillary: 95 mg/dL (ref 70–99)

## 2013-02-16 SURGERY — TRANSURETHRAL RESECTION OF THE PROSTATE WITH GYRUS INSTRUMENTS
Anesthesia: General | Wound class: Clean Contaminated

## 2013-02-16 MED ORDER — GENTAMICIN SULFATE 40 MG/ML IJ SOLN
5.0000 mg/kg | INTRAVENOUS | Status: DC
  Filled 2013-02-16: qty 11.25

## 2013-02-16 MED ORDER — ONDANSETRON HCL 4 MG/2ML IJ SOLN
INTRAMUSCULAR | Status: DC | PRN
  Administered 2013-02-16: 4 mg via INTRAVENOUS

## 2013-02-16 MED ORDER — PROPOFOL 10 MG/ML IV BOLUS
INTRAVENOUS | Status: DC | PRN
  Administered 2013-02-16: 150 mg via INTRAVENOUS

## 2013-02-16 MED ORDER — HYDROMORPHONE HCL PF 1 MG/ML IJ SOLN: 0.2500 mg | INTRAMUSCULAR | Status: DC | PRN

## 2013-02-16 MED ORDER — FENTANYL CITRATE 0.05 MG/ML IJ SOLN
INTRAMUSCULAR | Status: DC | PRN
  Administered 2013-02-16 (×3): 50 ug via INTRAVENOUS

## 2013-02-16 MED ORDER — HYDROCODONE-ACETAMINOPHEN 5-325 MG PO TABS: 1.0000 | ORAL_TABLET | ORAL | Status: DC | PRN

## 2013-02-16 MED ORDER — POTASSIUM CHLORIDE IN NACL 20-0.45 MEQ/L-% IV SOLN
INTRAVENOUS | Status: DC
  Administered 2013-02-16: 13:00:00 via INTRAVENOUS
  Filled 2013-02-16 (×3): qty 1000

## 2013-02-16 MED ORDER — MEPERIDINE HCL 50 MG/ML IJ SOLN: 6.2500 mg | INTRAMUSCULAR | Status: DC | PRN

## 2013-02-16 MED ORDER — EPHEDRINE SULFATE 50 MG/ML IJ SOLN
INTRAMUSCULAR | Status: DC | PRN
  Administered 2013-02-16 (×2): 10 mg via INTRAVENOUS

## 2013-02-16 MED ORDER — INSULIN ASPART 100 UNIT/ML ~~LOC~~ SOLN: 0.0000 [IU] | Freq: Three times a day (TID) | SUBCUTANEOUS | Status: DC

## 2013-02-16 MED ORDER — OXYCODONE HCL 5 MG PO TABS: 5.0000 mg | ORAL_TABLET | Freq: Once | ORAL | Status: DC | PRN

## 2013-02-16 MED ORDER — BISACODYL 10 MG RE SUPP: 10.0000 mg | Freq: Every day | RECTAL | Status: DC | PRN

## 2013-02-16 MED ORDER — 0.9 % SODIUM CHLORIDE (POUR BTL) OPTIME
TOPICAL | Status: DC | PRN
  Administered 2013-02-16: 1000 mL

## 2013-02-16 MED ORDER — LACTATED RINGERS IV SOLN: INTRAVENOUS | Status: DC

## 2013-02-16 MED ORDER — ONDANSETRON HCL 4 MG/2ML IJ SOLN: 4.0000 mg | INTRAMUSCULAR | Status: DC | PRN

## 2013-02-16 MED ORDER — CIPROFLOXACIN HCL 500 MG PO TABS
500.0000 mg | ORAL_TABLET | Freq: Two times a day (BID) | ORAL | Status: DC
  Administered 2013-02-16: 500 mg via ORAL
  Filled 2013-02-16 (×3): qty 1

## 2013-02-16 MED ORDER — HYOSCYAMINE SULFATE 0.125 MG SL SUBL
0.1250 mg | SUBLINGUAL_TABLET | SUBLINGUAL | Status: DC | PRN
  Filled 2013-02-16: qty 1

## 2013-02-16 MED ORDER — SIMVASTATIN 40 MG PO TABS
40.0000 mg | ORAL_TABLET | Freq: Every evening | ORAL | Status: DC
  Administered 2013-02-16: 40 mg via ORAL
  Filled 2013-02-16 (×2): qty 1

## 2013-02-16 MED ORDER — LISINOPRIL 10 MG PO TABS
10.0000 mg | ORAL_TABLET | Freq: Every evening | ORAL | Status: DC
  Administered 2013-02-16: 10 mg via ORAL
  Filled 2013-02-16 (×2): qty 1

## 2013-02-16 MED ORDER — ACETAMINOPHEN 325 MG PO TABS: 650.0000 mg | ORAL_TABLET | ORAL | Status: DC | PRN

## 2013-02-16 MED ORDER — ZOLPIDEM TARTRATE 5 MG PO TABS: 5.0000 mg | ORAL_TABLET | Freq: Every evening | ORAL | Status: DC | PRN

## 2013-02-16 MED ORDER — LACTATED RINGERS IV SOLN
INTRAVENOUS | Status: DC | PRN
  Administered 2013-02-16: 09:00:00 via INTRAVENOUS

## 2013-02-16 MED ORDER — SODIUM CHLORIDE 0.9 % IR SOLN
Status: DC | PRN
  Administered 2013-02-16: 12000 mL

## 2013-02-16 MED ORDER — GENTAMICIN SULFATE 40 MG/ML IJ SOLN
5.0000 mg/kg | INTRAVENOUS | Status: AC
  Administered 2013-02-16: 465 mg via INTRAVENOUS
  Filled 2013-02-16: qty 11.63

## 2013-02-16 MED ORDER — GENTAMICIN SULFATE 40 MG/ML IJ SOLN: 5.0000 mg/kg | INTRAVENOUS | Status: DC

## 2013-02-16 MED ORDER — OXYCODONE HCL 5 MG/5ML PO SOLN: 5.0000 mg | Freq: Once | ORAL | Status: DC | PRN

## 2013-02-16 MED ORDER — STERILE WATER FOR IRRIGATION IR SOLN
Status: DC | PRN
  Administered 2013-02-16: 500 mL

## 2013-02-16 MED ORDER — PROMETHAZINE HCL 25 MG/ML IJ SOLN: 6.2500 mg | INTRAMUSCULAR | Status: DC | PRN

## 2013-02-16 MED ORDER — HYDROMORPHONE HCL PF 1 MG/ML IJ SOLN
0.5000 mg | INTRAMUSCULAR | Status: DC | PRN
  Administered 2013-02-16 – 2013-02-17 (×2): 1 mg via INTRAVENOUS
  Filled 2013-02-16 (×3): qty 1

## 2013-02-16 MED ORDER — ACETAMINOPHEN 10 MG/ML IV SOLN: 1000.0000 mg | Freq: Once | INTRAVENOUS | Status: DC | PRN

## 2013-02-16 MED ORDER — DOCUSATE SODIUM 100 MG PO CAPS
100.0000 mg | ORAL_CAPSULE | Freq: Two times a day (BID) | ORAL | Status: DC
  Administered 2013-02-16: 100 mg via ORAL
  Filled 2013-02-16 (×3): qty 1

## 2013-02-16 MED ORDER — METOPROLOL TARTRATE 50 MG PO TABS
50.0000 mg | ORAL_TABLET | Freq: Two times a day (BID) | ORAL | Status: DC
  Administered 2013-02-16: 50 mg via ORAL
  Filled 2013-02-16 (×3): qty 1

## 2013-02-16 MED ORDER — METFORMIN HCL 500 MG PO TABS
500.0000 mg | ORAL_TABLET | Freq: Two times a day (BID) | ORAL | Status: DC
  Administered 2013-02-16 – 2013-02-17 (×2): 500 mg via ORAL
  Filled 2013-02-16 (×4): qty 1

## 2013-02-16 SURGICAL SUPPLY — 36 items
BAG URINE DRAINAGE (UROLOGICAL SUPPLIES) ×4 IMPLANT
BAG URINE LEG 500ML (DRAIN) ×2 IMPLANT
BAG URO CATCHER STRL LF (DRAPE) ×2 IMPLANT
BLADE SURG 15 STRL LF DISP TIS (BLADE) ×1 IMPLANT
BLADE SURG 15 STRL SS (BLADE) ×1
CATH FOLEY 3WAY 30CC 22FR (CATHETERS) ×2 IMPLANT
CLOTH BEACON ORANGE TIMEOUT ST (SAFETY) ×4 IMPLANT
COVER SURGICAL LIGHT HANDLE (MISCELLANEOUS) IMPLANT
DRAPE CAMERA CLOSED 9X96 (DRAPES) ×2 IMPLANT
DRSG TEGADERM 4X4.75 (GAUZE/BANDAGES/DRESSINGS) ×6 IMPLANT
ELECT BUTTON HF 24-28F 2 30DE (ELECTRODE) ×2 IMPLANT
ELECT HF RESECT BIPO 24F 45 ND (CUTTING LOOP) IMPLANT
ELECT LOOP MED HF 24F 12D (CUTTING LOOP) IMPLANT
ELECT REM PT RETURN 9FT ADLT (ELECTROSURGICAL)
ELECTRODE REM PT RTRN 9FT ADLT (ELECTROSURGICAL) IMPLANT
GLOVE SURG SS PI 8.0 STRL IVOR (GLOVE) ×2 IMPLANT
GOWN PREVENTION PLUS XLARGE (GOWN DISPOSABLE) IMPLANT
GOWN STRL REIN XL XLG (GOWN DISPOSABLE) ×4 IMPLANT
HOLDER FOLEY CATH W/STRAP (MISCELLANEOUS) ×2 IMPLANT
IV NS IRRIG 3000ML ARTHROMATIC (IV SOLUTION) IMPLANT
KIT ASPIRATION TUBING (SET/KITS/TRAYS/PACK) ×2 IMPLANT
KIT SUPRAPUBIC CATH (MISCELLANEOUS) ×2 IMPLANT
MANIFOLD NEPTUNE II (INSTRUMENTS) ×2 IMPLANT
NEEDLE HYPO 22GX1.5 SAFETY (NEEDLE) IMPLANT
NS IRRIG 1000ML POUR BTL (IV SOLUTION) IMPLANT
PACK CYSTO (CUSTOM PROCEDURE TRAY) ×2 IMPLANT
PENCIL BUTTON HOLSTER BLD 10FT (ELECTRODE) IMPLANT
PLUG CATH AND CAP STER (CATHETERS) IMPLANT
SPONGE DRAIN TRACH 4X4 STRL 2S (GAUZE/BANDAGES/DRESSINGS) ×2 IMPLANT
SUT ETHILON 3 0 FSL (SUTURE) IMPLANT
SUT ETHILON 3 0 PS 1 (SUTURE) ×2 IMPLANT
SYR 30ML LL (SYRINGE) ×2 IMPLANT
SYRINGE IRR TOOMEY STRL 70CC (SYRINGE) ×2 IMPLANT
TOWEL OR 17X26 10 PK STRL BLUE (TOWEL DISPOSABLE) IMPLANT
TUBING CONNECTING 10 (TUBING) ×2 IMPLANT
WATER STERILE IRR 3000ML UROMA (IV SOLUTION) IMPLANT

## 2013-02-16 NOTE — Interval H&P Note (Signed)
History and Physical Interval Note:  02/16/2013 8:35 AM  [place]  has presented today for surgery, with the diagnosis of BPH WITH RETENTION  The various methods of treatment have been discussed with the patient and family. After consideration of risks, benefits and other options for treatment, the patient has consented to  Procedure(s): TRANSURETHRAL RESECTION OF THE PROSTATE WITH GYRUS INSTRUMENTS (N/A) INSERTION OF SUPRAPUBIC CATHETER (N/A) as a surgical intervention .  The patient's history has been reviewed, patient examined, no change in status, stable for surgery.  I have reviewed the patient's chart and labs.  Questions were answered to the patient's satisfaction.     Hagen Tidd J

## 2013-02-16 NOTE — Progress Notes (Signed)
Pt has foley to leg bag on arrival to short stay

## 2013-02-16 NOTE — Brief Op Note (Signed)
02/16/2013  9:56 AM  PATIENT:  Ramond Dial  78 y.o. male  PRE-OPERATIVE DIAGNOSIS:  BPH WITH RETENTION  POST-OPERATIVE DIAGNOSIS:  BPH WITH RETENTION  PROCEDURE:  Procedure(s): TRANSURETHRAL RESECTION OF THE PROSTATE WITH GYRUS INSTRUMENTS (N/A) INSERTION OF SUPRAPUBIC CATHETER (N/A)  SURGEON:  Surgeon(s) and Role:    * Anner Crete, MD - Primary  PHYSICIAN ASSISTANT:   ASSISTANTS: none   ANESTHESIA:   general  EBL:  Total I/O In: 500 [I.V.:500] Out: 150 [Urine:150]  BLOOD ADMINISTERED:none  DRAINS: Urinary Catheter (Foley) and Urinary Catheter (Suprapubic)   LOCAL MEDICATIONS USED:  NONE  SPECIMEN:  No Specimen  DISPOSITION OF SPECIMEN:  N/A  COUNTS:  YES  TOURNIQUET:  * No tourniquets in log *  DICTATION: . Dictated but number not recorded.   PLAN OF CARE: Admit for overnight observation  PATIENT DISPOSITION:  PACU - hemodynamically stable.   Delay start of Pharmacological VTE agent (>24hrs) due to surgical blood loss or risk of bleeding: yes

## 2013-02-16 NOTE — Transfer of Care (Signed)
Immediate Anesthesia Transfer of Care Note  Patient: [place] Hobbs  Procedure(s) Performed: Procedure(s): TRANSURETHRAL RESECTION OF THE PROSTATE WITH GYRUS INSTRUMENTS (N/A) INSERTION OF SUPRAPUBIC CATHETER (N/A)  Patient Location: PACU  Anesthesia Type:General  Level of Consciousness: awake, alert , oriented and patient cooperative  Airway & Oxygen Therapy: Patient Spontanous Breathing and Patient connected to face mask oxygen  Post-op Assessment: Report given to PACU RN and Post -op Vital signs reviewed and stable  Post vital signs: Reviewed and stable  Complications: No apparent anesthesia complications

## 2013-02-16 NOTE — Anesthesia Postprocedure Evaluation (Signed)
Anesthesia Post Note  Patient: Cody Hobbs  Procedure(s) Performed: Procedure(s) (LRB): TRANSURETHRAL RESECTION OF THE PROSTATE WITH GYRUS INSTRUMENTS (N/A) INSERTION OF SUPRAPUBIC CATHETER (N/A)  Anesthesia type: General  Patient location: PACU  Post pain: Pain level controlled  Post assessment: Post-op Vital signs reviewed  Last Vitals: BP 137/68  Pulse 65  Temp(Src) 36.7 C (Oral)  Resp 12  Wt 198 lb 6.6 oz (90 kg)  BMI 24.16 kg/m2  SpO2 100%  Post vital signs: Reviewed  Level of consciousness: sedated  Complications: No apparent anesthesia complications

## 2013-02-16 NOTE — Anesthesia Preprocedure Evaluation (Signed)
Anesthesia Evaluation  Patient identified by MRN, date of birth, ID band Patient awake    Reviewed: Allergy & Precautions  History of Anesthesia Complications Negative for: history of anesthetic complications  Airway Mallampati: II TM Distance: >3 FB Neck ROM: Full    Dental  (+) Edentulous Upper, Edentulous Lower and Dental Advisory Given   Pulmonary neg pulmonary ROS,  breath sounds clear to auscultation  Pulmonary exam normal       Cardiovascular hypertension, Pt. on medications and Pt. on home beta blockers - angina+ CAD and + CABG Rhythm:Regular Rate:Normal     Neuro/Psych negative neurological ROS     GI/Hepatic negative GI ROS,   Endo/Other  diabetes, Type 2  Renal/GU Renal diseasenegative Renal ROS     Musculoskeletal   Abdominal Normal abdominal exam  (+) + obese,   Peds  Hematology negative hematology ROS (+)   Anesthesia Other Findings   Reproductive/Obstetrics                           Anesthesia Physical  Anesthesia Plan  ASA: III  Anesthesia Plan: General   Post-op Pain Management:    Induction: Intravenous  Airway Management Planned: LMA  Additional Equipment:   Intra-op Plan:   Post-operative Plan: Extubation in OR  Informed Consent: I have reviewed the patients History and Physical, chart, labs and discussed the procedure including the risks, benefits and alternatives for the proposed anesthesia with the patient or authorized representative who has indicated his/her understanding and acceptance.   Dental advisory given  Plan Discussed with: CRNA  Anesthesia Plan Comments:         Anesthesia Quick Evaluation

## 2013-02-16 NOTE — Progress Notes (Signed)
Patient ID: Cody Hobbs, male   DOB: 05/09/1935, 78 y.o.   MRN: [id number] Post-op note  Subjective: The patient is doing well.  His only complaint is of penile burning.  He is confused as to why he has two foley bags.     Objective: Vital signs in last 24 hours: Temp:  [97.4 F (36.3 C)-98 F (36.7 C)] 97.4 F (36.3 C) (04/15 1356) Pulse Rate:  [64-76] 70 (04/15 1356) Resp:  [12-20] 14 (04/15 1356) BP: (132-159)/(64-82) 138/68 mmHg (04/15 1356) SpO2:  [98 %-100 %] 100 % (04/15 1356) Weight:  [90 kg (198 lb 6.6 oz)] 90 kg (198 lb 6.6 oz) (04/15 1128)  Intake/Output from previous day:   Intake/Output this shift: Total I/O In: 1030 [P.O.:330; I.V.:700] Out: 230 [Urine:230]  Physical Exam:  General: Alert and oriented. Abdomen: Soft, Nondistended. SP site C/D/I Urine in SP bag bloody; urethral bag dark yellow  Lab Results:  Recent Labs  02/15/13 0845  HGB 13.6  HCT 42.0    Assessment/Plan: POD#0   1) Continue to monitor 2) Pain control, amb, I.S, DVT prophy    LOS: 0 days   YARBROUGH,Bailey Faiella G. 02/16/2013, 2:37 PM

## 2013-02-17 ENCOUNTER — Encounter (HOSPITAL_COMMUNITY): Payer: Self-pay | Admitting: Urology

## 2013-02-17 LAB — GLUCOSE, CAPILLARY: Glucose-Capillary: 97 mg/dL (ref 70–99)

## 2013-02-17 MED ORDER — HYDROCODONE-ACETAMINOPHEN 5-325 MG PO TABS: 1.0000 | ORAL_TABLET | ORAL | Status: DC | PRN

## 2013-02-17 NOTE — Discharge Summary (Signed)
Physician Discharge Summary  Patient ID: Cody Hobbs MRN: [id number] DOB/AGE: [birth year] 78 y.o.  Admit date: 02/16/2013 Discharge date: 02/17/2013  Admission Diagnoses: Hypotonic bladder with BPH and BOO  Discharge Diagnoses: Same Active Problems: Hypotonic bladder. BPH with BOO and retention   Discharged Condition: good  Hospital Course: [name] had a TUEVP and placement of an SP tube on 4/15.   During the procedure the tip of the SP tube undermined the prostate but that was recognized and corrected.  His is doing well without complaints or hematuria.   He will be discharged home today with the SP plugged and the foley to SD.   I will have him return on Monday for a voiding trial.   Consults: None  Significant Diagnostic Studies: none  Treatments: surgery: TUEVP and SP tube insertion  Discharge Exam: Blood pressure 135/71, pulse 61, temperature 97.7 F (36.5 C), temperature source Oral, resp. rate 16, height 6\' 4"  (1.93 m), weight 90 kg (198 lb 6.6 oz), SpO2 100.00%. General appearance: alert and no distress GI: soft and flat with a dry SP tube dressing.  Male genitalia: normal, with foley draining clear urine.   Disposition: 01-Home or Self Care  Discharge Orders   Future Orders Complete By Expires     Discontinue IV  As directed         Medication List    TAKE these medications       aspirin 325 MG tablet  Take 325 mg by mouth daily.     ciprofloxacin 500 MG tablet  Commonly known as:  CIPRO  Take 500 mg by mouth 2 (two) times daily.     HYDROcodone-acetaminophen 5-325 MG per tablet  Commonly known as:  NORCO/VICODIN  Take 1-2 tablets by mouth every 4 (four) hours as needed.     lisinopril 10 MG tablet  Commonly known as:  PRINIVIL,ZESTRIL  Take 10 mg by mouth every evening.     metFORMIN 500 MG tablet  Commonly known as:  GLUCOPHAGE  Take 500 mg by mouth 2 (two) times daily with a meal.     metoprolol 50 MG tablet  Commonly known as:   LOPRESSOR  Take 50 mg by mouth 2 (two) times daily.     simvastatin 40 MG tablet  Commonly known as:  ZOCOR  Take 40 mg by mouth every evening.           Follow-up Information   Follow up with Anner Crete, MD On 02/22/2013. (8 am)    Contact information:   [address] [place] [address] [phone]       Signed: Anner Crete 02/17/2013, 8:17 AM

## 2013-02-17 NOTE — Op Note (Signed)
NAMEMEDARDO, HASSING            ACCOUNT NO.:  [id number]  MEDICAL RECORD NO.:  [id number]  LOCATION:  1441                         FACILITY:  [facility]  PHYSICIAN:  Excell Seltzer. Annabell Howells, M.D.    DATE OF BIRTH:  02-02-1935  DATE OF PROCEDURE:  02/16/2013 DATE OF DISCHARGE:                              OPERATIVE REPORT   PREOPERATIVE DIAGNOSIS:  Benign prostatic hyperplasia with bladder outlet obstruction with urinary retention and a hypotonic bladder.  POSTOPERATIVE DIAGNOSIS:  Benign prostatic hyperplasia with bladder outlet obstruction with urinary retention and a hypotonic bladder.  PROCEDURE:  Transurethral electrovaporization of the prostate and placement of a trocar suprapubic tube.  SURGEON:  Excell Seltzer. Annabell Howells, M.D.  ANESTHESIA:  General.  SPECIMEN:  None.  DRAINS:  Fourteen-French trocar suprapubic tube and a 22-French Foley catheter.  ESTIMATED BLOOD LOSS:  Minimal.  SPECIMEN:  None.  COMPLICATIONS:  None.  INDICATIONS:  Mr. Devaul is a 78 year old white male with history of retention, who on urodynamics was found to have hypotonic bladder, but he did have a sustained but weak detrusor contraction with inability to void.  It was felt that TUR of the prostate was his best option for managing his condition but a suprapubic tube should be placed should he have continued problems urinating after surgery.  PROCEDURE IN DETAIL:  He was given gentamicin per pharmacy.  He was taken to the operating room where general anesthetic was induced.  His Foley was removed.  He was placed in lithotomy position and fitted with PAS hose.  His perineum and genitalia were prepped with Betadine solution.  He was draped in usual sterile fashion.  Cystoscopy was performed using a 22-French scope and 12-degree lens. Examination revealed a normal urethra.  The external sphincter was intact.  The prostatic urethra was approximately 2 cm in length with bilobar hyperplasia with some obstruction.   Examination of bladder revealed mild trabeculation.  There was erythema on the posterior wall consistent with chronic Foley.  The ureteral orifices were unremarkable.  A 28-French continuous flow resectoscope sheath was then inserted without difficulty.  This was fitted with an [place] handle, a 12- degree lens and a gyrus bipolar button, saline was used as the irrigant. Vaporization of prostate was initiated at the bladder neck where the fibers were exposed from 5-7 o'clock.  Floor of the prostate was then vaporized out to alongside the verumontanum.  The right lobe of the prostate was vaporized from bladder neck to apex out to the capsular fibers followed by the left lobe.  Residual apical and anterior tissue was removed as needed. Once an adequate channel been created a point was marked 2 cm above the pubis in the midline and the patient was placed in Trendelenburg position, a stab wound was made in this area and the 14-French Cook loop suprapubic tube was placed using the trocar.  Upon initial entry into the bladder, there was flow of air from the vaporization followed by urine drainage.  At this point, the catheter was advanced in place and did not seem to drain appropriately.  I then cystoscoped him once again with resectoscope and had difficulty identifying the loop of the suprapubic catheter.  Eventually inspection of  the prostatic urethra revealed that the tip of the suprapubic catheter had undermined the prostatic adenoma and a channel between the adenoma and the surgical capsule.  The tube was backed out until properly positioned in the bladder and the loop was formed and secured.  Once the suprapubic tube was in position, additional vaporization was performed to further create the channel and the prostatic fossa was widely fulgurated.  At this point, a 22-French Foley catheter was inserted.  The balloon was filled with 30 mL of sterile fluid.  The catheter was irrigated  with the return of small bits, but otherwise clear return.  Once the irrigant returned free of debris, the suprapubic tube and Foley catheter were placed to straight drainage.  The suprapubic tube was secured with the attachment device provided in the kit.  The patient was then taken down from lithotomy position.  His anesthetic was reversed.  He was moved to recovery in stable condition.  There were no complications.     Excell Seltzer. Annabell Howells, M.D.     JJW/MEDQ  D:  02/16/2013  T:  02/17/2013  Job:  [id number]

## 2013-02-26 ENCOUNTER — Ambulatory Visit (INDEPENDENT_AMBULATORY_CARE_PROVIDER_SITE_OTHER): Payer: Medicare Other | Admitting: Urology

## 2013-02-26 DIAGNOSIS — N401 Enlarged prostate with lower urinary tract symptoms: Secondary | ICD-10-CM

## 2013-02-26 DIAGNOSIS — N312 Flaccid neuropathic bladder, not elsewhere classified: Secondary | ICD-10-CM

## 2013-02-26 DIAGNOSIS — R339 Retention of urine, unspecified: Secondary | ICD-10-CM

## 2013-02-26 DIAGNOSIS — R82998 Other abnormal findings in urine: Secondary | ICD-10-CM

## 2013-02-26 DIAGNOSIS — R350 Frequency of micturition: Secondary | ICD-10-CM

## 2013-04-09 ENCOUNTER — Ambulatory Visit (INDEPENDENT_AMBULATORY_CARE_PROVIDER_SITE_OTHER): Payer: Medicare Other | Admitting: Urology

## 2013-04-09 DIAGNOSIS — N401 Enlarged prostate with lower urinary tract symptoms: Secondary | ICD-10-CM

## 2013-04-09 DIAGNOSIS — N312 Flaccid neuropathic bladder, not elsewhere classified: Secondary | ICD-10-CM

## 2013-06-09 ENCOUNTER — Other Ambulatory Visit: Payer: Self-pay

## 2013-08-10 ENCOUNTER — Telehealth: Payer: Self-pay | Admitting: Interventional Cardiology

## 2013-08-10 ENCOUNTER — Ambulatory Visit (INDEPENDENT_AMBULATORY_CARE_PROVIDER_SITE_OTHER): Payer: Medicare Other | Admitting: *Deleted

## 2013-08-10 ENCOUNTER — Ambulatory Visit: Payer: Medicare Other

## 2013-08-10 VITALS — BP 154/80 | HR 67 | Ht 76.0 in | Wt 204.0 lb

## 2013-08-10 DIAGNOSIS — I10 Essential (primary) hypertension: Secondary | ICD-10-CM

## 2013-08-10 NOTE — Progress Notes (Signed)
Patient presents to office for nurse visit/BP check per request at last office visit. Patient informed nurse that he started the new BP medication 2 days ago. Patient states that he has been monitoring his BP at home with good readings. Patient did not bring home readings to office today. Patient did not bring medications to office today and said that we should already have a copy of them. Nurse called [facility]. Office , spoke with Dr. Michaelle Copas nurse to review medications. Patient denies sob, dizziness or chest pain. Patient has taken all medications as prescribed without missing any doses. Patient denies having side effects from medications. Patient informed that information would be sent to his primary cardiologist Dr. Verdis Prime, and he would be contacted by his nurse with response.

## 2013-08-10 NOTE — Telephone Encounter (Signed)
Patient should be on Lisinopril Hctz 20/12.5 mg daily along with metoprolol 50 mg BID. Please confirm the lisinopril dose and have him continue to monitor BP at home.

## 2013-08-11 NOTE — Telephone Encounter (Signed)
pt adv.Patient should be on Lisinopril Hctz 20/12.5 mg daily along with metoprolol 50 mg BID. Please confirm the lisinopril dose and have him continue to monitor BP at home. pt bp today 124/62 pt will record bp and fax 2 weeks of readings

## 2013-09-09 ENCOUNTER — Other Ambulatory Visit: Payer: Self-pay

## 2013-10-15 ENCOUNTER — Ambulatory Visit (INDEPENDENT_AMBULATORY_CARE_PROVIDER_SITE_OTHER): Payer: Medicare Other | Admitting: Urology

## 2013-10-15 DIAGNOSIS — Z87440 Personal history of urinary (tract) infections: Secondary | ICD-10-CM

## 2013-10-15 DIAGNOSIS — N312 Flaccid neuropathic bladder, not elsewhere classified: Secondary | ICD-10-CM

## 2013-10-15 DIAGNOSIS — N401 Enlarged prostate with lower urinary tract symptoms: Secondary | ICD-10-CM

## 2013-10-20 ENCOUNTER — Ambulatory Visit (INDEPENDENT_AMBULATORY_CARE_PROVIDER_SITE_OTHER): Payer: Medicare Other | Admitting: Interventional Cardiology

## 2013-10-20 ENCOUNTER — Encounter: Payer: Self-pay | Admitting: Interventional Cardiology

## 2013-10-20 VITALS — BP 118/70 | HR 66 | Ht 76.0 in | Wt 207.8 lb

## 2013-10-20 DIAGNOSIS — E119 Type 2 diabetes mellitus without complications: Secondary | ICD-10-CM

## 2013-10-20 DIAGNOSIS — I2510 Atherosclerotic heart disease of native coronary artery without angina pectoris: Secondary | ICD-10-CM

## 2013-10-20 DIAGNOSIS — I951 Orthostatic hypotension: Secondary | ICD-10-CM | POA: Insufficient documentation

## 2013-10-20 DIAGNOSIS — E785 Hyperlipidemia, unspecified: Secondary | ICD-10-CM

## 2013-10-20 DIAGNOSIS — I10 Essential (primary) hypertension: Secondary | ICD-10-CM

## 2013-10-20 MED ORDER — LISINOPRIL-HYDROCHLOROTHIAZIDE 20-12.5 MG PO TABS: 1.0000 | ORAL_TABLET | Freq: Every day | ORAL | Status: DC

## 2013-10-20 NOTE — Progress Notes (Signed)
Patient ID: Cody Hobbs, male   DOB: 02-Nov-1935, 78 y.o.   MRN: [id number]    [address] [place], [place]  [address] Phone: [phone] Fax:  [phone]  Date:  10/20/2013   ID:  Cody Hobbs, DOB 1935/02/02, MRN [id number]  PCP:  Louie Boston, MD   ASSESSMENT:  1. Dizziness when standing from sitting position 2. Coronary artery disease with recent bypass grafting, stable without angina 3. Hypertension with significant orthostatic drop with standing 4. Hyperlipidemia on therapy 5. Diabetes mellitus  PLAN:  1. Decrease lisinopril HCT 20/12.5 mg per day. Until he finishes up his current supply of 20/25 mg, he should use one half of these tablets per day. 2. Return in 6 months for clinical followup 3. Call if orthostatic dizziness continues after adjustment in lisinopril HCTZ   SUBJECTIVE: Cody Hobbs is a 78 y.o. male has not had angina. His major complaint is that of feeling lightheaded and at times near fainting with standing. This is been going on since 1 artery bypass grafting. He did have one fall associated with positional dizziness. His symptoms are not vertigo. There is no nausea or spinning sensation. He denies dyspnea. He is exercising every day. Keeping his weight under control. There is no peripheral edema   Wt Readings from Last 3 Encounters:  10/20/13 207 lb 12.8 oz (94.257 kg)  08/10/13 204 lb (92.534 kg)  02/16/13 198 lb 6.6 oz (90 kg)     Past Medical History  Diagnosis Date  . CAD (coronary artery disease)   . Hyperlipidemia   . Kidney stones   . Hypertension   . Dysrhythmia     bifascicular block  . Benign prostatic hypertrophy with urinary retention   . Diabetes mellitus without complication   . Seasonal allergies     Current Outpatient Prescriptions  Medication Sig Dispense Refill  . aspirin 325 MG tablet Take 325 mg by mouth daily.      Marland Kitchen lisinopril-hydrochlorothiazide (PRINZIDE,ZESTORETIC) 20-25 MG per  tablet Take 1 tablet by mouth daily.      . metFORMIN (GLUCOPHAGE) 500 MG tablet Take 500 mg by mouth 2 (two) times daily with a meal.      . metoprolol (LOPRESSOR) 50 MG tablet Take 50 mg by mouth 2 (two) times daily.      . simvastatin (ZOCOR) 40 MG tablet Take 40 mg by mouth every evening.       No current facility-administered medications for this visit.    Allergies:    Allergies  Allergen Reactions  . Penicillins Itching    Social History:  The patient  reports that he quit smoking about 20 years ago. His smoking use included Cigarettes. He started smoking about 66 years ago. He has a 45 pack-year smoking history. He has never used smokeless tobacco. He reports that he does not drink alcohol or use illicit drugs.   ROS:  Please see the history of present illness.   Denies transient neurological symptoms. Has not had syncope. No episodes of angina or nitroglycerin use.   All other systems reviewed and negative.   OBJECTIVE: VS:  BP 118/70  Pulse 66  Ht 6\' 4"  (1.93 m)  Wt 207 lb 12.8 oz (94.257 kg)  BMI 25.30 kg/m2 Well nourished, well developed, in no acute distress, healthy HEENT: normal Neck: JVD flat. Carotid bruit absent  Cardiac:  normal S1, S2; RRR; no murmur Lungs:  clear to auscultation bilaterally, no wheezing, rhonchi or rales  Abd: soft, nontender, no hepatomegaly Ext: Edema absent. Pulses 2+ Skin: warm and dry Neuro:  CNs 2-12 intact, no focal abnormalities noted  EKG:  EKG reveals normal sinus rhythm left anterior hemiblock, right bundle branch block.       Signed, Darci Needle III, MD 10/20/2013 3:33 PM  Past Medical History  Coronary atherosclerotic heart disease status post coronary bypass surgery 1995 and PCI on SVG with BMS 2008. Repeat CABG 12/2012 - Dr. Katrinka Blazing   Hypertension   Hyperlipidemia

## 2013-10-20 NOTE — Patient Instructions (Signed)
Reduce Lisinopril HCTZ take 1/2 tablet 10-12.5mg  daily until your supply at home is completed  A new Rx for Lisinopril HCTZ 20-12.5mg  daily has been sent to your pharmacy.( Start once your at home supply is completed)  Take all other medications as prescribed  Your physician wants you to follow-up in: 6 months You will receive a reminder letter in the mail two months in advance. If you don't receive a letter, please call our office to schedule the follow-up appointment.

## 2013-10-29 IMAGING — CR DG CHEST 2V
2 series · 2 of 2 positions shown · non-contrast
Comparison: Chest x-ray of 11/28/2012

CLINICAL DATA: History of CABG

CHEST - 2 VIEW

[w chest pa]
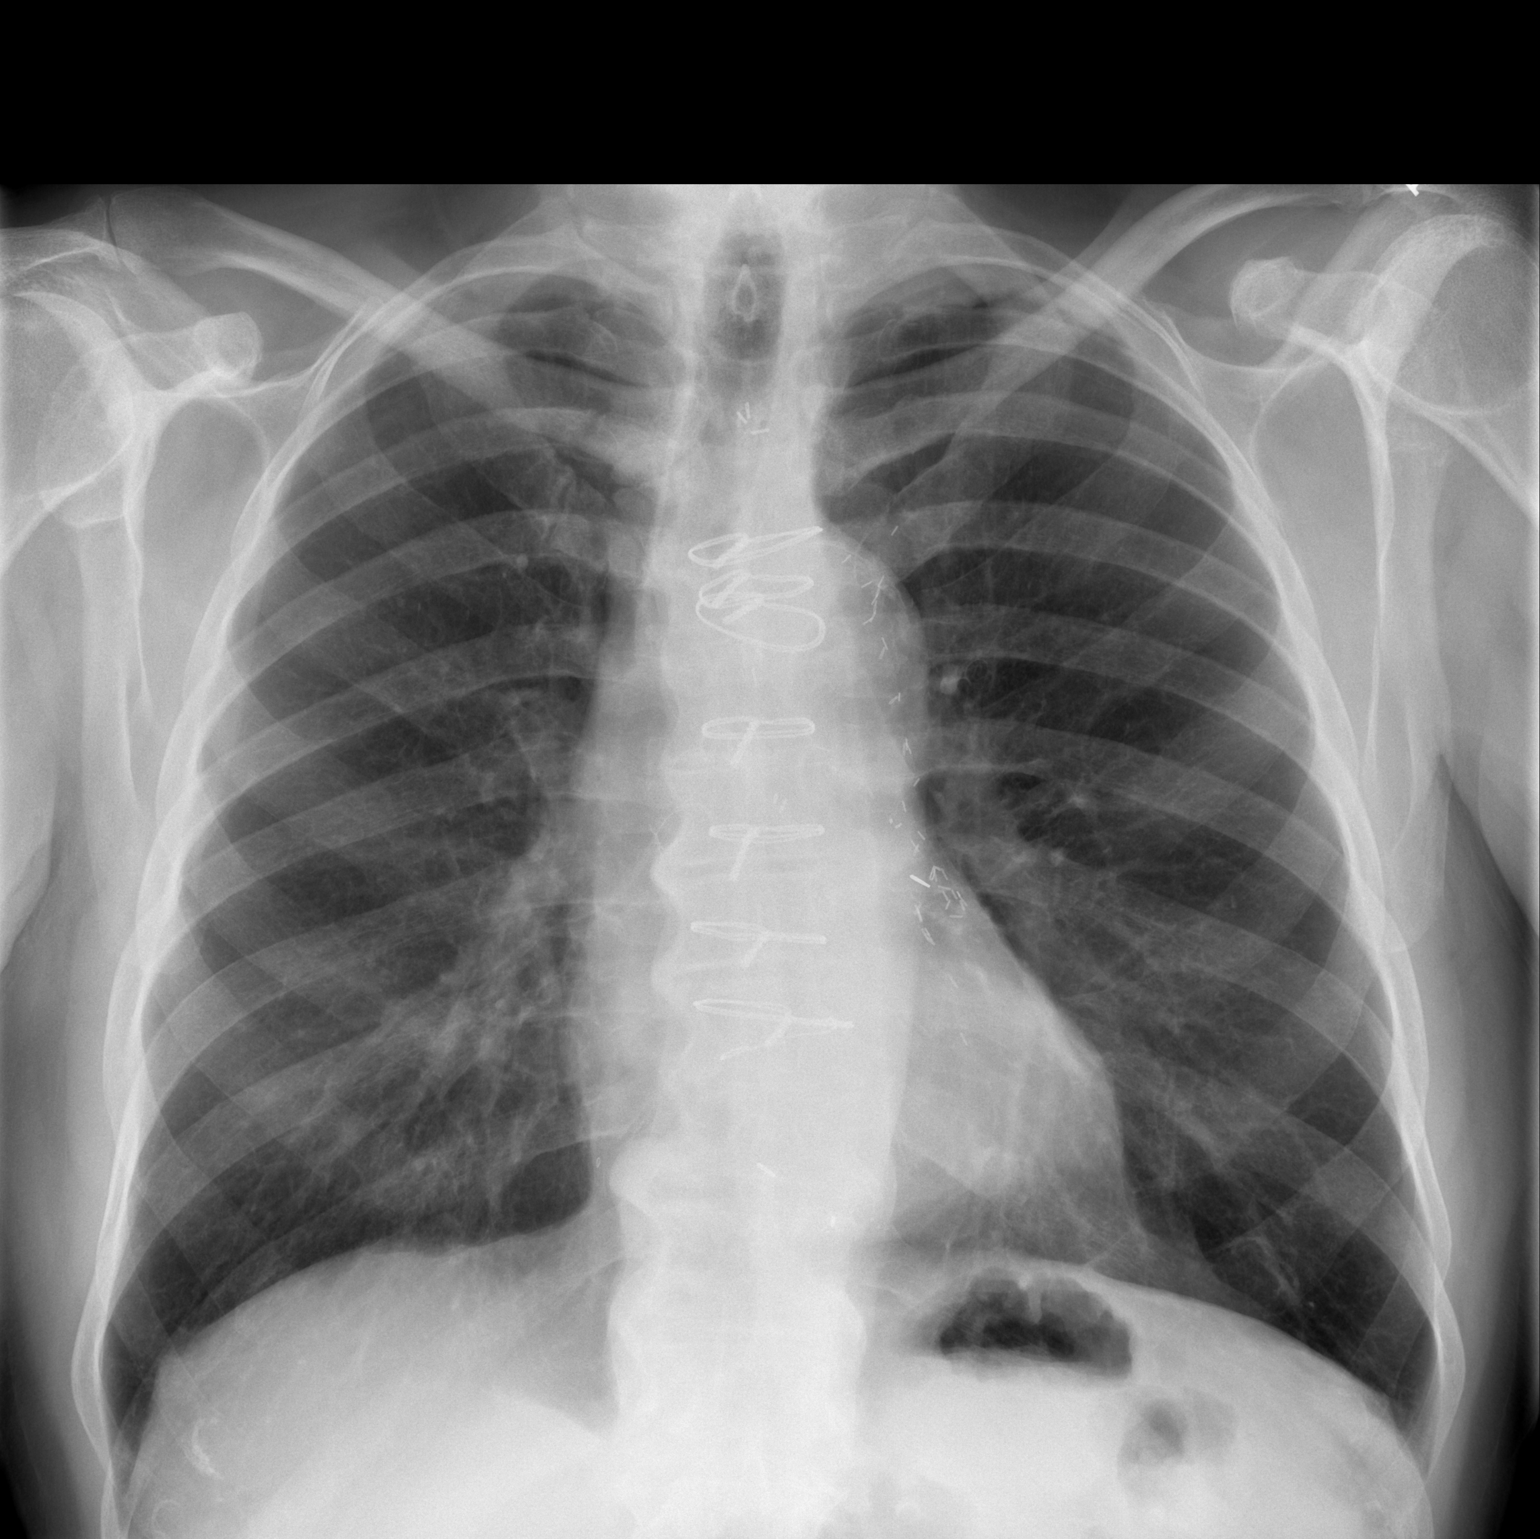

[w chest lat]
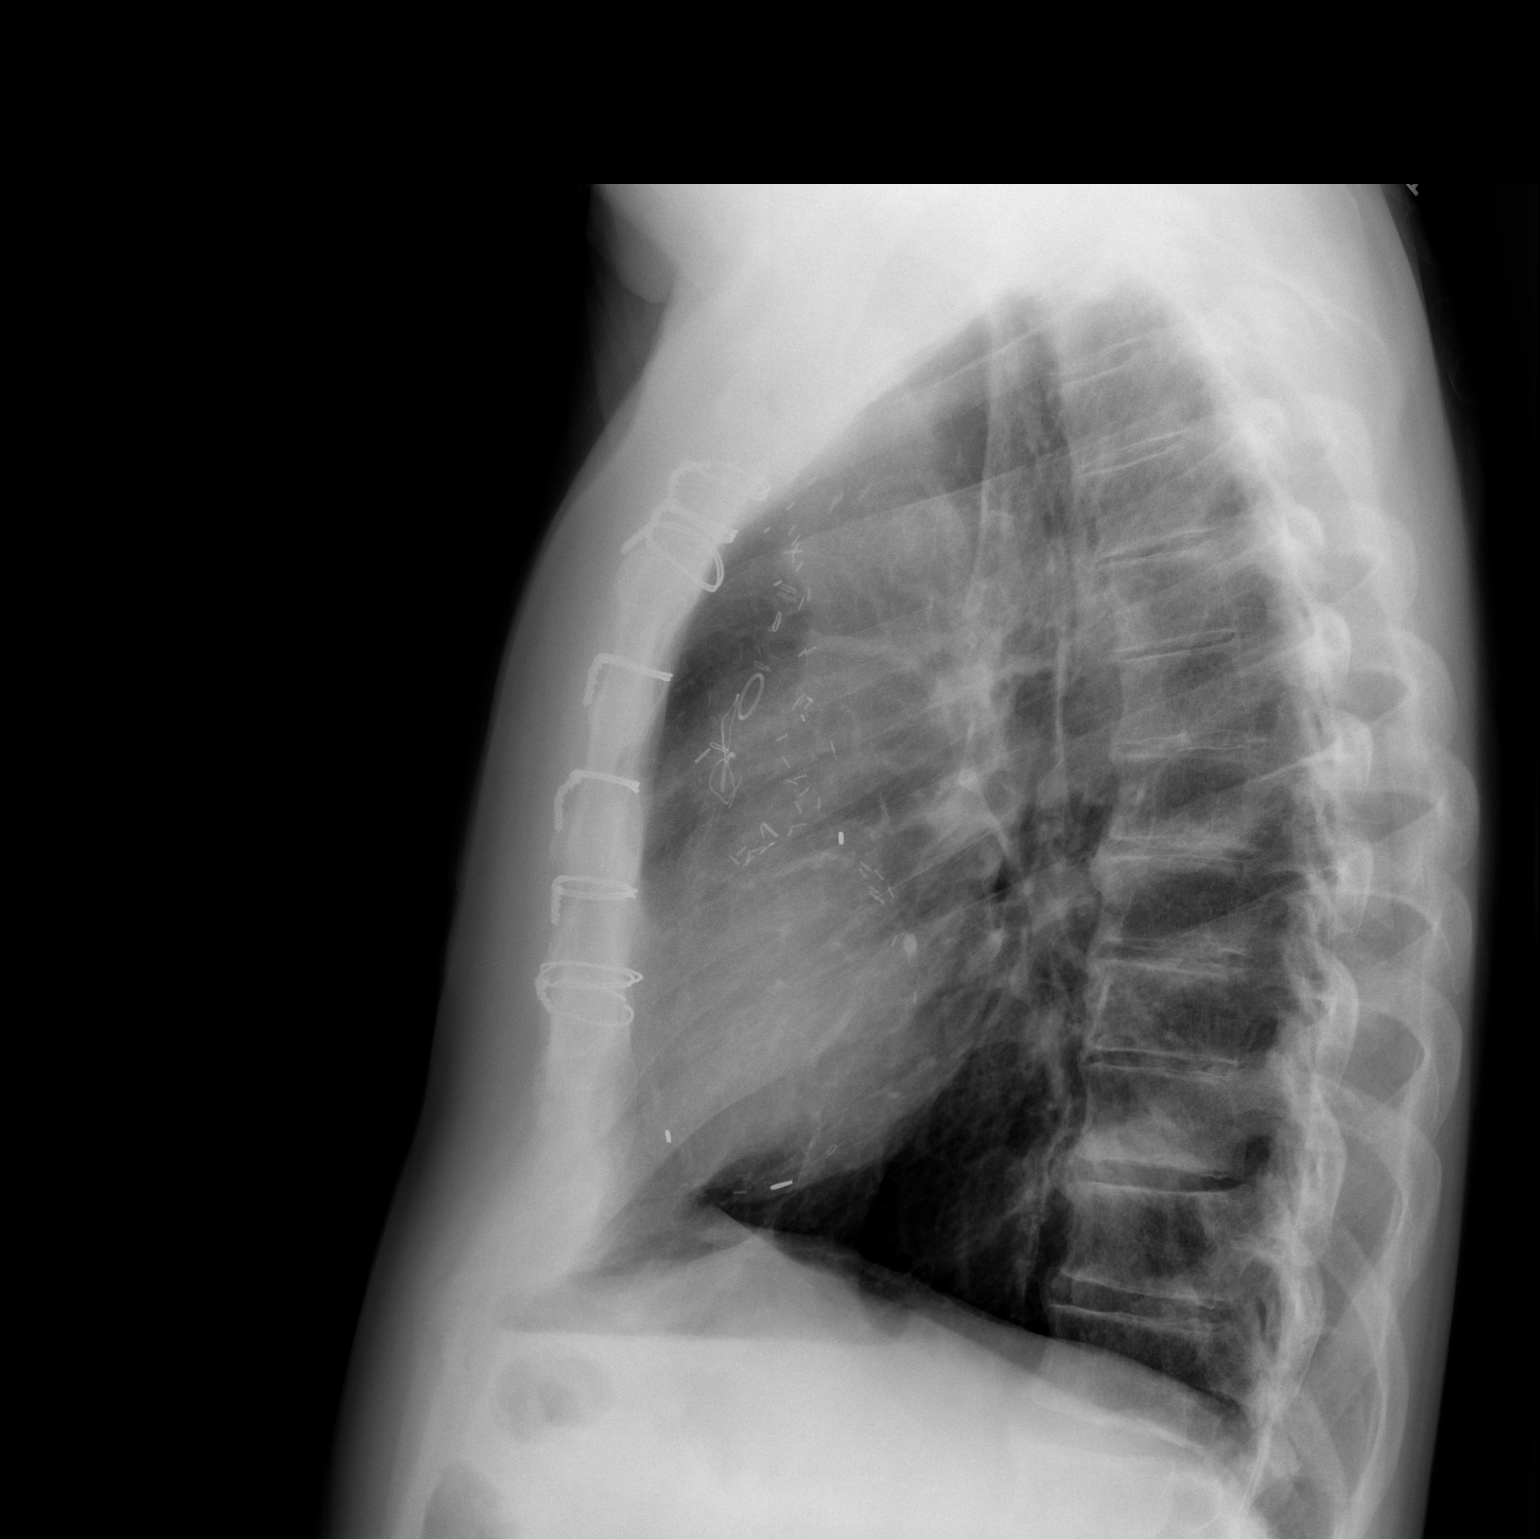

[2 of 2 positions shown; findings below may reference images not displayed]

FINDINGS: The previously noted pleural effusions have resolved.  No
infiltrate is seen. Aeration of the lungs has improved .
Mediastinal contours are stable.  The heart is within upper limits
normal in stable.  Median sternotomy sutures are noted from CABG.
There are degenerative changes in the mid to lower thoracic spine.
IMPRESSION: Improved aeration with resolution of effusions.

## 2014-04-25 ENCOUNTER — Ambulatory Visit (INDEPENDENT_AMBULATORY_CARE_PROVIDER_SITE_OTHER): Payer: Medicare Other | Admitting: Interventional Cardiology

## 2014-04-25 ENCOUNTER — Encounter: Payer: Self-pay | Admitting: Interventional Cardiology

## 2014-04-25 VITALS — BP 122/75 | HR 81 | Ht 76.0 in | Wt 194.0 lb

## 2014-04-25 DIAGNOSIS — I739 Peripheral vascular disease, unspecified: Secondary | ICD-10-CM

## 2014-04-25 DIAGNOSIS — I2510 Atherosclerotic heart disease of native coronary artery without angina pectoris: Secondary | ICD-10-CM

## 2014-04-25 DIAGNOSIS — E1159 Type 2 diabetes mellitus with other circulatory complications: Secondary | ICD-10-CM

## 2014-04-25 DIAGNOSIS — I10 Essential (primary) hypertension: Secondary | ICD-10-CM

## 2014-04-25 DIAGNOSIS — E785 Hyperlipidemia, unspecified: Secondary | ICD-10-CM

## 2014-04-25 NOTE — Patient Instructions (Signed)
Your physician recommends that you continue on your current medications as directed. Please refer to the Current Medication list given to you today.  Your physician has requested that you have a lower extremity arterial  duplex. During this test,  ultrasound are used to evaluate arterial blood flow in the legs. Allow one hour for this exam. There are no restrictions or special instructions.    Your physician wants you to follow-up in: 1 year You will receive a reminder letter in the mail two months in advance. If you don't receive a letter, please call our office to schedule the follow-up appointment.  

## 2014-04-25 NOTE — Progress Notes (Signed)
Patient ID: Cody Hobbs, male   DOB: 11/30/1934, 79 y.o.   MRN: [id number]    [address] [place]  [id number] Phone: [phone]) [phone] Fax:  [phone]  Date:  04/25/2014   ID:  Cody Dialunstall T Gibeault, DOB 08/11/1935, MRN [id number]  PCP:  Louie BostonAPPER,DAVID B, MD   ASSESSMENT:  1. Difficulty walking, leg fatigue, possibly related to peripheral vascular disease and claudication 2. Coronary artery disease, asymptomatic 3. Hypertension, controlled 4. Weight loss, 13 pounds since January 5. Diabetes mellitus, type II with peripheral vascular disease 6. Difficulty with swallow  PLAN:  1. Lower extremity Dopplers/ABI study 2. Aerobic activity 3. Clinical followup in one year   SUBJECTIVE: Cody Dialunstall T Lodico is a 79 y.o. male who is doing well. He denies angina. He complains of weakness in his legs and decreasing ability to walk. He does not describe pain, just weakness. There is some numbness and tingling. He denies orthopnea, PND, or palpitations. No medication side effects. He is losing some weight. There is some difficulty with swallowing the   Wt Readings from Last 3 Encounters:  04/25/14 194 lb (87.998 kg)  10/20/13 207 lb 12.8 oz (94.257 kg)  08/10/13 204 lb (92.534 kg)     Past Medical History  Diagnosis Date  . CAD (coronary artery disease)   . Hyperlipidemia   . Kidney stones   . Hypertension   . Dysrhythmia     bifascicular block  . Benign prostatic hypertrophy with urinary retention   . Diabetes mellitus without complication   . Seasonal allergies     Current Outpatient Prescriptions  Medication Sig Dispense Refill  . aspirin 325 MG tablet Take 325 mg by mouth daily.      Marland Kitchen. atenolol (TENORMIN) 50 MG tablet Take 50 mg by mouth daily.      Marland Kitchen. lisinopril-hydrochlorothiazide (PRINZIDE,ZESTORETIC) 20-12.5 MG per tablet Take 12.5 tablets by mouth daily.      . metFORMIN (GLUCOPHAGE) 500 MG tablet Take 500 mg by mouth 2 (two) times daily with a meal.       . metoprolol (LOPRESSOR) 50 MG tablet Take 50 mg by mouth 2 (two) times daily.      . simvastatin (ZOCOR) 40 MG tablet Take 40 mg by mouth every evening.       No current facility-administered medications for this visit.    Allergies:    Allergies  Allergen Reactions  . Penicillins Itching    Social History:  The patient  reports that he quit smoking about 21 years ago. His smoking use included Cigarettes. He started smoking about 66 years ago. He has a 45 pack-year smoking history. He has never used smokeless tobacco. He reports that he does not drink alcohol or use illicit drugs.   ROS:  Please see the history of present illness.   Appetite is stable. He denies dysphagia although states that he doesn't swallow as well as previous to his repeat bypass. He has not had reflux symptoms.   All other systems reviewed and negative.   OBJECTIVE: VS:  BP 122/75  Pulse 81  Ht 6\' 4"  (1.93 m)  Wt 194 lb (87.998 kg)  BMI 23.62 kg/m2 Well nourished, well developed, in no acute distress, slender, age consistent with the appearance HEENT: normal Neck: JVD flat. Carotid bruit absent  Cardiac:  normal S1, S2; RRR; no murmur Lungs:  clear to auscultation bilaterally, no wheezing, rhonchi or rales Abd: soft, nontender, no hepatomegaly Ext: Edema absent. Pulses absent  with bilateral femoral bruits Skin: warm and dry Neuro:  CNs 2-12 intact, no focal abnormalities noted  EKG:  Not repeated       Signed, Darci NeedleHenry W. B. Smith III, MD 04/25/2014 12:12 PM

## 2014-04-26 ENCOUNTER — Telehealth: Payer: Self-pay | Admitting: Interventional Cardiology

## 2014-04-26 NOTE — Telephone Encounter (Signed)
New message      Pt is not taking lopressor but it is on his medications list.  He saw Dr Katrinka BlazingSmith yesterday.  Should he be taking the lopressor?

## 2014-04-27 ENCOUNTER — Other Ambulatory Visit (HOSPITAL_COMMUNITY): Payer: Self-pay | Admitting: Radiology

## 2014-04-27 DIAGNOSIS — I70219 Atherosclerosis of native arteries of extremities with intermittent claudication, unspecified extremity: Secondary | ICD-10-CM

## 2014-04-27 NOTE — Telephone Encounter (Signed)
Please up date med list and remove metoprolol.

## 2014-04-27 NOTE — Telephone Encounter (Signed)
returned pt wife.adv her that metoprolol 50mg  bid  was not d/c by Dr.Smith.she sts that pt has not taken metoprolol for almost 6 months.asked he if metoprol was stopped by pt pcp.she sts that pt was having dizziness with falls a couple of months ago and that it could of been stopped by his pcp.pt wife sts that pt has been doing well.pt seen by Dr.Smith on 6/22 and his bp/heartrate were good.pt wife sts that she is concerned that if it is restarted pt might have a issue with dizziness again.pt wife agreeable and verbalized understanding

## 2014-04-28 ENCOUNTER — Encounter (INDEPENDENT_AMBULATORY_CARE_PROVIDER_SITE_OTHER): Payer: Medicare Other

## 2014-04-28 DIAGNOSIS — I739 Peripheral vascular disease, unspecified: Secondary | ICD-10-CM

## 2014-04-28 DIAGNOSIS — I70219 Atherosclerosis of native arteries of extremities with intermittent claudication, unspecified extremity: Secondary | ICD-10-CM

## 2014-04-28 NOTE — Telephone Encounter (Signed)
called to give pt Dr.Smith response ok to d/c metoprolol pt should not restart.lmtcb

## 2014-04-28 NOTE — Telephone Encounter (Signed)
pt aware of Dr.Smith instructions.ok to d/c metoprolol.pt does not need to restart.pt verbalized understanding.

## 2014-04-29 ENCOUNTER — Encounter: Payer: Self-pay | Admitting: Interventional Cardiology

## 2014-04-29 DIAGNOSIS — I739 Peripheral vascular disease, unspecified: Secondary | ICD-10-CM | POA: Insufficient documentation

## 2014-05-03 ENCOUNTER — Telehealth: Payer: Self-pay

## 2014-05-03 NOTE — Telephone Encounter (Signed)
pt aware of LE Duplex results.Lower extremity blockage noted in right leg behind the knee. Needs to increase walking. May need surgery if does not improve.pt verbalized understanding.

## 2014-05-03 NOTE — Telephone Encounter (Signed)
Message copied by Jarvis NewcomerPARRIS-GODLEY, LISA S on Tue May 03, 2014 12:56 PM ------      Message from: Verdis PrimeSMITH, HENRY      Created: Fri Apr 29, 2014  4:29 PM       Lower extremity blockage noted in right leg behind the knee. Needs to increase walking. May need surgery if does not improve. ------

## 2014-06-24 ENCOUNTER — Telehealth: Payer: Self-pay

## 2014-06-24 NOTE — Telephone Encounter (Signed)
Pt wife given LE dopp results and Dr.Smith recommendations.The right lower extremity has blocked arteries. Increase walking. May need to refer to a peripheral vascular disease specialists if walking symptoms don't improve with regular exercise. He should call me in 3 months to give me a followup on his exertional ability.pt wife verbalized understanding.

## 2014-06-24 NOTE — Telephone Encounter (Signed)
Message copied by Jarvis NewcomerPARRIS-GODLEY, LISA S on Fri Jun 24, 2014 12:33 PM ------      Message from: Verdis PrimeSMITH, HENRY      Created: Mon May 16, 2014 12:57 PM       The right lower extremity has blocked arteries. Increase walking. May need to refer to a peripheral vascular disease specialists if walking symptoms don't improve with regular exercise. He should call me in 3 months to give me a followup on his exertional ability. ------

## 2014-09-05 ENCOUNTER — Encounter: Payer: Self-pay | Admitting: Interventional Cardiology

## 2014-10-13 ENCOUNTER — Encounter (HOSPITAL_COMMUNITY): Payer: Self-pay | Admitting: Interventional Cardiology

## 2015-05-03 ENCOUNTER — Encounter: Payer: Self-pay | Admitting: *Deleted

## 2015-05-03 NOTE — Progress Notes (Signed)
Cardiology Office Note   Date:  05/04/2015   ID:  Cody Hobbs, DOB 01/27/1935, MRN [id number]  PCP:  Louie BostonAPPER,DAVID B, MD  Cardiologist:  Lesleigh NoeSMITH III,HENRY W, MD   Chief Complaint  Patient presents with  . Coronary Artery Disease      History of Present Illness: Cody Hobbs is a 80 y.o. male who presents for CAD, hypertension, bifascicular block, hyperlipidemia, and recent diagnosis (01/2015) of colon CA with resection and no chemotherapy required.  Cody Hobbs is doing well. He denies angina. He had hemoglobins as low as 7.5 but no cardiovascular complaints. The anemia led to the diagnosis of colon cancer which was resected by Dr. Marcha Soldersathey at [facility]. He has done well. He has seen Dr. Laurie PandaNystrom. He has not needed to use nitroglycerin. He denies dyspnea.      Past Medical History  Diagnosis Date  . CAD (coronary artery disease)   . Hyperlipidemia   . Kidney stones   . Hypertension   . Dysrhythmia     bifascicular block  . Benign prostatic hypertrophy with urinary retention   . Diabetes mellitus without complication   . Seasonal allergies     Past Surgical History  Procedure Laterality Date  . Tonsillectomy    . Vasectomy    . Cabg with lima to lad, svg to om2 and sequential svg to pda and distal circumflex  1995  . Coronary angioplasty with stent placement  2009  . Coronary artery bypass graft  11/25/2012    Procedure: REDO CORONARY ARTERY BYPASS GRAFTING (CABG);  Surgeon: Alleen BorneBryan K Bartle, MD;  [facility];  Service: Open Heart Surgery;  Laterality: N/A;  . Transurethral resection of prostate N/A 02/16/2013    Procedure: TRANSURETHRAL RESECTION OF THE PROSTATE WITH GYRUS INSTRUMENTS;  Surgeon: Anner CreteJohn J Wrenn, MD;  Location: [facility] ORS;  Service: Urology;  Laterality: N/A;  . Insertion of suprapubic catheter N/A 02/16/2013    Procedure: INSERTION OF SUPRAPUBIC CATHETER;  Surgeon: Anner CreteJohn J Wrenn, MD;  Location: [facility] ORS;  Service: Urology;  Laterality: N/A;  .  Left and right heart catheterization with coronary/graft angiogram N/A 10/30/2012    Procedure: LEFT AND RIGHT HEART CATHETERIZATION WITH Isabel CapriceORONARY/GRAFT ANGIOGRAM;  Surgeon: Lesleigh NoeHenry W Smith III, MD;  [facility];  Service: Cardiovascular;  Laterality: N/A;     Current Outpatient Prescriptions  Medication Sig Dispense Refill  . ascorbic acid (VITAMIN C) 500 MG tablet Take 500 mg by mouth daily.    Marland Kitchen. aspirin 325 MG tablet Take 325 mg by mouth daily.    Marland Kitchen. atenolol (TENORMIN) 50 MG tablet Take 50 mg by mouth daily.    . Ferrous Gluconate 325 (36 FE) MG TABS Take 325 mg by mouth daily.    Marland Kitchen. lisinopril-hydrochlorothiazide (PRINZIDE,ZESTORETIC) 20-12.5 MG per tablet Take 12.5 tablets by mouth daily.    . metFORMIN (GLUCOPHAGE) 500 MG tablet Take 500 mg by mouth 2 (two) times daily with a meal.    . omeprazole (PRILOSEC) 20 MG capsule Take 20 mg by mouth daily.    . simvastatin (ZOCOR) 40 MG tablet Take 40 mg by mouth every evening.     No current facility-administered medications for this visit.    Allergies:   Penicillins    Social History:  The patient  reports that he quit smoking about 22 years ago. His smoking use included Cigarettes. He started smoking about 67 years ago. He has a 45 pack-year smoking history. He has never used smokeless  tobacco. He reports that he does not drink alcohol or use illicit drugs.   Family History:  The patient's family history includes Aneurysm in his sister; Breast cancer in his sister; Diabetes in his sister; Heart attack in his brother and father; Heart disease in his sister; Stomach cancer in his mother.    ROS:  Please see the history of present illness.   Otherwise, review of systems are positive for no melanoma. Appetite is good but he has had weight loss. There is no evidence of metastasis according to the patient based upon the workup done by his oncologist..   All other systems are reviewed and negative.    PHYSICAL EXAM: VS:  BP 162/80  mmHg  Pulse 70  Ht  (1.93 m)  Wt 83.371 kg (183 lb 12.8 oz)  BMI 22.38 kg/m2 , BMI Body mass index is 22.38 kg/(m^2). GEN: Well nourished, well developed, in no acute distress HEENT: normal Neck: no JVD, carotid bruits, or masses Cardiac: RRR; no murmurs, rubs, or gallops,no edema  Respiratory:  clear to auscultation bilaterally, normal work of breathing GI: soft, nontender, nondistended, + BS MS: no deformity or atrophy Skin: warm and dry, no rash Neuro:  Strength and sensation are intact Psych: euthymic mood, full affect   EKG:  EKG is ordered today. The ekg ordered today demonstrates normal sinus rhythm, atrial abnormality, right bundle branch block, left axis deviation.   Recent Labs: No results found for requested labs within last 365 days.    Lipid Panel No results found for: CHOL, TRIG, HDL, CHOLHDL, VLDL, LDLCALC, LDLDIRECT    Wt Readings from Last 3 Encounters:  05/04/15 83.371 kg (183 lb 12.8 oz)  04/25/14 87.998 kg (194 lb)  10/20/13 94.257 kg (207 lb 12.8 oz)      Other studies Reviewed: Additional studies/ records that were reviewed today include: Colon cancer with resection with in the past 3 months. No evidence of spread. Done at [facility] Denies Laurie Panda is the oncologist.    ASSESSMENT AND PLAN:  1. Atherosclerosis of coronary artery bypass graft of native heart without angina pectoris No angina  2. PVD (peripheral vascular disease) Claudication in the right lower extremity has improved with exercise  3. Hyperlipidemia No recent data. Followed by primary care.   4. Essential hypertension Poor control. Not taking medication as prescribed.    Current medicines are reviewed at length with the patient today.  The patient does not have concerns regarding medicines.  The following changes have been made:  I advised the patient that he should follow his blood pressure closely. He has decreased his lisinopril HCT to every  other day dosing and I encouraged him to go back to once a day therapy if his blood pressures are running greater than 140/90 consistently.  Labs/ tests ordered today include:   Orders Placed This Encounter  Procedures  . EKG 12-Lead     Disposition:   FU with HS in 1 year  Signed, Lesleigh Noe, MD  05/04/2015 9:14 AM    [facility] [address] [place], [place], [place]  [address] Phone: [phone]; Fax: [phone]

## 2015-05-04 ENCOUNTER — Ambulatory Visit (INDEPENDENT_AMBULATORY_CARE_PROVIDER_SITE_OTHER): Payer: Medicare Other | Admitting: Interventional Cardiology

## 2015-05-04 ENCOUNTER — Encounter: Payer: Self-pay | Admitting: Interventional Cardiology

## 2015-05-04 VITALS — BP 162/80 | HR 70 | Ht 76.0 in | Wt 183.8 lb

## 2015-05-04 DIAGNOSIS — I10 Essential (primary) hypertension: Secondary | ICD-10-CM

## 2015-05-04 DIAGNOSIS — E785 Hyperlipidemia, unspecified: Secondary | ICD-10-CM

## 2015-05-04 DIAGNOSIS — I25810 Atherosclerosis of coronary artery bypass graft(s) without angina pectoris: Secondary | ICD-10-CM | POA: Diagnosis not present

## 2015-05-04 DIAGNOSIS — I739 Peripheral vascular disease, unspecified: Secondary | ICD-10-CM | POA: Diagnosis not present

## 2015-05-04 NOTE — Patient Instructions (Signed)

## 2016-04-22 ENCOUNTER — Telehealth: Payer: Self-pay | Admitting: Interventional Cardiology

## 2016-04-22 NOTE — Telephone Encounter (Signed)
New Message  Pt wife call to inform that pt has relocated and wanted  Dr. Katrinka BlazingSmith and RN to know

## 2016-04-30 NOTE — Telephone Encounter (Signed)
Thanks. He needs to get with a Physician soon. Needs cardiologist as well.
